# Patient Record
Sex: Female | Born: 1995 | Race: White | Hispanic: Yes | Marital: Married | Smoking: Never smoker
Health system: Southern US, Community
[De-identification: ages and names within clinical notes are randomized; demographics above are authoritative.]

## PROBLEM LIST (undated history)

## (undated) DIAGNOSIS — Z789 Other specified health status: Secondary | ICD-10-CM

## (undated) HISTORY — PX: WISDOM TOOTH EXTRACTION: SHX21

---
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
## 2018-07-10 NOTE — L&D Delivery Note (Signed)
OB/GYN Faculty Practice Delivery Note  Kimberly Fox is a 23 y.o. G1P0 s/p SVD at [redacted]w[redacted]d. She was admitted for spontaneous onset of labor.   ROM: 4h 74m with clear fluid GBS Status: positive Maximum Maternal Temperature: Temp (24hrs), Avg:98.5 F (36.9 C), Min:97.8 F (36.6 C), Max:99.2 F (37.3 C)  Labor Progress: . Admitted in active labor . SROM around 0630 . AROM forebag . Progressed to complete without augmentation  Delivery Date/Time: 02/10/19 at 1057 Delivery: Called to room and patient was complete and pushing. Head delivered ROA. No nuchal cord present. Shoulder and body delivered in usual fashion. Infant with spontaneous cry, placed on mother's abdomen, dried and stimulated. Cord clamped x 2 after 1-minute delay, and cut by father of baby. Cord blood drawn. Placenta delivered spontaneously with gentle cord traction. Fundus firm with massage and Pitocin. Labia, perineum, vagina, and cervix inspected inspected with 1st degree perineal and bilateral periurethral lacerations.   Placenta: spontaneous, intact, 3-vessel cord (parents to bring home with them) Complications: terminal meconium Lacerations: 1st degree perineal, bilateral periurethral repaired with 3-0 Vicryl Rapide EBL: 400cc, see delivery summary for Triton final QBL Analgesia: local lidocaine for repair of lacerations   Postpartum Planning [/] message to sent to schedule follow-up  [x]  vaccines UTD  Infant: Vigorous female   APGARs 9, 9  weight pending but appears AGA  Laurel S. Juleen China, DO OB/GYN Fellow, Faculty Practice

## 2018-07-16 LAB — OB RESULTS CONSOLE HEPATITIS B SURFACE ANTIGEN: Hepatitis B Surface Ag: NEGATIVE

## 2018-07-16 LAB — OB RESULTS CONSOLE RUBELLA ANTIBODY, IGM: Rubella: IMMUNE

## 2018-11-08 LAB — OB RESULTS CONSOLE HIV ANTIBODY (ROUTINE TESTING): HIV: NONREACTIVE

## 2018-11-08 LAB — OB RESULTS CONSOLE RPR: RPR: NONREACTIVE

## 2019-01-22 LAB — OB RESULTS CONSOLE GBS: GBS: POSITIVE

## 2019-01-22 LAB — OB RESULTS CONSOLE GC/CHLAMYDIA
Chlamydia: NEGATIVE
Gonorrhea: NEGATIVE

## 2019-02-09 ENCOUNTER — Inpatient Hospital Stay (EMERGENCY_DEPARTMENT_HOSPITAL)
Admission: AD | Admit: 2019-02-09 | Discharge: 2019-02-09 | Disposition: A | Discharge status: Home / Self Care | Payer: BC Managed Care – PPO | Source: Home / Self Care | Attending: Obstetrics & Gynecology | Admitting: Obstetrics & Gynecology

## 2019-02-09 ENCOUNTER — Other Ambulatory Visit: Payer: Self-pay

## 2019-02-09 ENCOUNTER — Encounter (HOSPITAL_COMMUNITY): Payer: Self-pay

## 2019-02-09 DIAGNOSIS — Z3A39 39 weeks gestation of pregnancy: Secondary | ICD-10-CM

## 2019-02-09 DIAGNOSIS — O471 False labor at or after 37 completed weeks of gestation: Secondary | ICD-10-CM | POA: Diagnosis not present

## 2019-02-09 HISTORY — DX: Other specified health status: Z78.9

## 2019-02-09 NOTE — Discharge Instructions (Signed)
Braxton Hicks Contractions °Contractions of the uterus can occur throughout pregnancy, but they are not always a sign that you are in labor. You may have practice contractions called Braxton Hicks contractions. These false labor contractions are sometimes confused with true labor. °What are Braxton Hicks contractions? °Braxton Hicks contractions are tightening movements that occur in the muscles of the uterus before labor. Unlike true labor contractions, these contractions do not result in opening (dilation) and thinning of the cervix. Toward the end of pregnancy (32-34 weeks), Braxton Hicks contractions can happen more often and may become stronger. These contractions are sometimes difficult to tell apart from true labor because they can be very uncomfortable. You should not feel embarrassed if you go to the hospital with false labor. °Sometimes, the only way to tell if you are in true labor is for your health care provider to look for changes in the cervix. The health care provider will do a physical exam and may monitor your contractions. If you are not in true labor, the exam should show that your cervix is not dilating and your water has not broken. °If there are no other health problems associated with your pregnancy, it is completely safe for you to be sent home with false labor. You may continue to have Braxton Hicks contractions until you go into true labor. °How to tell the difference between true labor and false labor °True labor °· Contractions last 30-70 seconds. °· Contractions become very regular. °· Discomfort is usually felt in the top of the uterus, and it spreads to the lower abdomen and low back. °· Contractions do not go away with walking. °· Contractions usually become more intense and increase in frequency. °· The cervix dilates and gets thinner. °False labor °· Contractions are usually shorter and not as strong as true labor contractions. °· Contractions are usually irregular. °· Contractions  are often felt in the front of the lower abdomen and in the groin. °· Contractions may go away when you walk around or change positions while lying down. °· Contractions get weaker and are shorter-lasting as time goes on. °· The cervix usually does not dilate or become thin. °Follow these instructions at home: ° °· Take over-the-counter and prescription medicines only as told by your health care provider. °· Keep up with your usual exercises and follow other instructions from your health care provider. °· Eat and drink lightly if you think you are going into labor. °· If Braxton Hicks contractions are making you uncomfortable: °? Change your position from lying down or resting to walking, or change from walking to resting. °? Sit and rest in a tub of warm water. °? Drink enough fluid to keep your urine pale yellow. Dehydration may cause these contractions. °? Do slow and deep breathing several times an hour. °· Keep all follow-up prenatal visits as told by your health care provider. This is important. °Contact a health care provider if: °· You have a fever. °· You have continuous pain in your abdomen. °Get help right away if: °· Your contractions become stronger, more regular, and closer together. °· You have fluid leaking or gushing from your vagina. °· You pass blood-tinged mucus (bloody show). °· You have bleeding from your vagina. °· You have low back pain that you never had before. °· You feel your baby’s head pushing down and causing pelvic pressure. °· Your baby is not moving inside you as much as it used to. °Summary °· Contractions that occur before labor are   called Braxton Hicks contractions, false labor, or practice contractions. °· Braxton Hicks contractions are usually shorter, weaker, farther apart, and less regular than true labor contractions. True labor contractions usually become progressively stronger and regular, and they become more frequent. °· Manage discomfort from Braxton Hicks contractions  by changing position, resting in a warm bath, drinking plenty of water, or practicing deep breathing. °This information is not intended to replace advice given to you by your health care provider. Make sure you discuss any questions you have with your health care provider. °Document Released: 11/09/2016 Document Revised: 06/08/2017 Document Reviewed: 11/09/2016 °Elsevier Patient Education © 2020 [employer] ° °Safe Medications in Pregnancy  ° °Acne: °Benzoyl Peroxide °Salicylic Acid ° °Backache/Headache: °Tylenol: 2 regular strength every 4 hours OR °             2 Extra strength every 6 hours ° °Colds/Coughs/Allergies: °Benadryl (alcohol free) 25 mg every 6 hours as needed °Breath right strips °Claritin °Cepacol throat lozenges °Chloraseptic throat spray °Cold-Eeze- up to three times per day °Cough drops, alcohol free °Flonase (by prescription only) °Guaifenesin °Mucinex °Robitussin DM (plain only, alcohol free) °Saline nasal spray/drops °Sudafed (pseudoephedrine) & Actifed ** use only after [redacted] weeks gestation and if you do not have high blood pressure °Tylenol °Vicks Vaporub °Zinc lozenges °Zyrtec  ° °Constipation: °Colace °Ducolax suppositories °Fleet enema °Glycerin suppositories °Metamucil °Milk of magnesia °Miralax °Senokot °Smooth move tea ° °Diarrhea: °Kaopectate °Imodium A-D ° °*NO pepto Bismol ° °Hemorrhoids: °Anusol °Anusol HC °Preparation H °Tucks ° °Indigestion: °Tums °Maalox °Mylanta °Zantac  °Pepcid ° °Insomnia: °Benadryl (alcohol free) 25mg every 6 hours as needed °Tylenol PM °Unisom, no Gelcaps ° °Leg Cramps: °Tums °MagGel ° °Nausea/Vomiting:  °Bonine °Dramamine °Emetrol °Ginger extract °Sea bands °Meclizine  °Nausea medication to take during pregnancy:  °Unisom (doxylamine succinate 25 mg tablets) Take one tablet daily at bedtime. If symptoms are not adequately controlled, the dose can be increased to a maximum recommended dose of two tablets daily (1/2 tablet in the morning, 1/2 tablet  mid-afternoon and one at bedtime). °Vitamin B6 100mg tablets. Take one tablet twice a day (up to 200 mg per day). ° °Skin Rashes: °Aveeno products °Benadryl cream or 25mg every 6 hours as needed °Calamine Lotion °1% cortisone cream ° °Yeast infection: °Gyne-lotrimin 7 °Monistat 7 ° ° °**If taking multiple medications, please check labels to avoid duplicating the same active ingredients °**take medication as directed on the label °** Do not exceed 4000 mg of tylenol in 24 hours °**Do not take medications that contain aspirin or ibuprofen ° ° ° ° °

## 2019-02-09 NOTE — MAU Provider Note (Signed)
S: Kimberly Fox is a 23 y.o. G1P0 at [redacted]w[redacted]d  who presents to MAU today complaining contractions q 5-10 minutes since this morning. She denies vaginal bleeding. She denies LOF. She reports normal fetal movement.    O: BP 128/70 (BP Location: Right Arm)   Pulse 90   Temp 97.8 F (36.6 C) (Oral)   Resp 18   Ht 5' (1.524 m)   Wt 78 kg   SpO2 99%   BMI 33.59 kg/m  GENERAL: Well-developed, well-nourished female in no acute distress.  HEAD: Normocephalic, atraumatic.  CHEST: Normal effort of breathing, regular heart rate ABDOMEN: Soft, nontender, gravid  Cervical exam:  Dilation: 3 Effacement (%): 80 Cervical Position: Middle Station: -2 Presentation: Vertex Exam by:: Joya Gaskins RN   Fetal Monitoring: Baseline: 125 Variability: moderate Accelerations: 15x15 Decelerations: none Contractions: 2-7  Patient was observed for 1.5 hours and no cervical change made.  A: SIUP at [redacted]w[redacted]d  False labor  P: Discharge home -Labor precautions discussed -Patient advised to follow-up with [facility] as scheduled for prenatal care -Patient may return to MAU as needed or if her condition were to change or worsen   Wende Mott, [place] 02/09/2019 10:19 PM

## 2019-02-09 NOTE — MAU Note (Signed)
Pt here with reports of contractions every 5-6 minutes apart. Pt denies LOF or vaginal bleeding. She does report some pink spotting. Reports good fetal movement. Cervix was 1cm 2 weeks ago. Pt reports that she gets here care at [facility]. EDC 02/12/19 per pt

## 2019-02-09 NOTE — MAU Note (Signed)
I have communicated with Len Blalock CNM and reviewed vital signs:  Vitals:   02/09/19 2030 02/09/19 2235  BP: 128/70 126/75  Pulse: 90 90  Resp:    Temp:    SpO2:      Vaginal exam:  Dilation: 3 Effacement (%): 80 Cervical Position: Middle Station: -2 Presentation: Vertex Exam by:: K. WeissRN,   Also reviewed contraction pattern and that non-stress test is reactive.  It has been documented that patient is contracting every 1.5 - 5 minutes with no cervical change over  2 1/2 hours not indicating active labor.  Patient denies any other complaints.  Based on this report provider has given order for discharge.  A discharge order and diagnosis entered by a provider.   Labor discharge instructions reviewed with patient.

## 2019-02-09 NOTE — MAU Note (Signed)
Pt wanted to wait another hour and be recheck prior to going home.

## 2019-02-10 ENCOUNTER — Inpatient Hospital Stay (HOSPITAL_COMMUNITY)
Admission: AD | Admit: 2019-02-10 | Discharge: 2019-02-12 | DRG: 807 | Disposition: A | Discharge status: Home / Self Care | Payer: BC Managed Care – PPO | Attending: Obstetrics & Gynecology | Admitting: Obstetrics & Gynecology

## 2019-02-10 ENCOUNTER — Encounter (HOSPITAL_COMMUNITY): Payer: Self-pay | Admitting: *Deleted

## 2019-02-10 DIAGNOSIS — O99824 Streptococcus B carrier state complicating childbirth: Secondary | ICD-10-CM | POA: Diagnosis present

## 2019-02-10 DIAGNOSIS — B951 Streptococcus, group B, as the cause of diseases classified elsewhere: Secondary | ICD-10-CM | POA: Diagnosis present

## 2019-02-10 DIAGNOSIS — Z20828 Contact with and (suspected) exposure to other viral communicable diseases: Secondary | ICD-10-CM | POA: Diagnosis present

## 2019-02-10 DIAGNOSIS — Z3A39 39 weeks gestation of pregnancy: Secondary | ICD-10-CM

## 2019-02-10 DIAGNOSIS — O26893 Other specified pregnancy related conditions, third trimester: Secondary | ICD-10-CM | POA: Diagnosis present

## 2019-02-10 HISTORY — DX: Streptococcus, group b, as the cause of diseases classified elsewhere: B95.1

## 2019-02-10 LAB — CBC
HCT: 40.9 % (ref 36.0–46.0)
Hemoglobin: 13.4 g/dL (ref 12.0–15.0)
MCH: 25.5 pg — ABNORMAL LOW (ref 26.0–34.0)
MCHC: 32.8 g/dL (ref 30.0–36.0)
MCV: 77.9 fL — ABNORMAL LOW (ref 80.0–100.0)
Platelets: 233 10*3/uL (ref 150–400)
RBC: 5.25 MIL/uL — ABNORMAL HIGH (ref 3.87–5.11)
RDW: 15.1 % (ref 11.5–15.5)
WBC: 16.4 10*3/uL — ABNORMAL HIGH (ref 4.0–10.5)
nRBC: 0 % (ref 0.0–0.2)

## 2019-02-10 LAB — POCT FERN TEST: POCT Fern Test: NEGATIVE

## 2019-02-10 LAB — ABO/RH: ABO/RH(D): B POS

## 2019-02-10 LAB — TYPE AND SCREEN
ABO/RH(D): B POS
Antibody Screen: NEGATIVE

## 2019-02-10 LAB — SARS CORONAVIRUS 2 BY RT PCR (HOSPITAL ORDER, PERFORMED IN ~~LOC~~ HOSPITAL LAB): SARS Coronavirus 2: NEGATIVE

## 2019-02-10 LAB — RPR: RPR Ser Ql: NONREACTIVE

## 2019-02-10 MED ORDER — LIDOCAINE HCL (PF) 1 % IJ SOLN
30.0000 mL | INTRAMUSCULAR | Status: AC | PRN
  Administered 2019-02-10: 30 mL via SUBCUTANEOUS
  Filled 2019-02-10: qty 30

## 2019-02-10 MED ORDER — ONDANSETRON HCL 4 MG/2ML IJ SOLN: 4.0000 mg | INTRAMUSCULAR | Status: DC | PRN

## 2019-02-10 MED ORDER — OXYCODONE-ACETAMINOPHEN 5-325 MG PO TABS: 2.0000 | ORAL_TABLET | ORAL | Status: DC | PRN

## 2019-02-10 MED ORDER — COCONUT OIL OIL: 1.0000 "application " | TOPICAL_OIL | Status: DC | PRN

## 2019-02-10 MED ORDER — WITCH HAZEL-GLYCERIN EX PADS: 1.0000 "application " | MEDICATED_PAD | CUTANEOUS | Status: DC | PRN

## 2019-02-10 MED ORDER — DIPHENHYDRAMINE HCL 25 MG PO CAPS: 25.0000 mg | ORAL_CAPSULE | Freq: Four times a day (QID) | ORAL | Status: DC | PRN

## 2019-02-10 MED ORDER — BENZOCAINE-MENTHOL 20-0.5 % EX AERO
1.0000 "application " | INHALATION_SPRAY | CUTANEOUS | Status: DC | PRN
  Administered 2019-02-10: 1 via TOPICAL
  Filled 2019-02-10 (×2): qty 56

## 2019-02-10 MED ORDER — FENTANYL CITRATE (PF) 100 MCG/2ML IJ SOLN
100.0000 ug | Freq: Once | INTRAMUSCULAR | Status: AC
  Administered 2019-02-10: 100 ug via INTRAVENOUS

## 2019-02-10 MED ORDER — ACETAMINOPHEN 325 MG PO TABS: 650.0000 mg | ORAL_TABLET | ORAL | Status: DC | PRN

## 2019-02-10 MED ORDER — FENTANYL CITRATE (PF) 100 MCG/2ML IJ SOLN
INTRAMUSCULAR | Status: AC
  Administered 2019-02-10: 100 ug via INTRAVENOUS
  Filled 2019-02-10: qty 2

## 2019-02-10 MED ORDER — FENTANYL CITRATE (PF) 100 MCG/2ML IJ SOLN
100.0000 ug | Freq: Once | INTRAMUSCULAR | Status: AC
  Administered 2019-02-10: 100 ug via INTRAVENOUS
  Filled 2019-02-10: qty 2

## 2019-02-10 MED ORDER — OXYCODONE-ACETAMINOPHEN 5-325 MG PO TABS: 1.0000 | ORAL_TABLET | ORAL | Status: DC | PRN

## 2019-02-10 MED ORDER — PRENATAL MULTIVITAMIN CH
1.0000 | ORAL_TABLET | Freq: Every day | ORAL | Status: DC
  Administered 2019-02-11 – 2019-02-12 (×2): 1 via ORAL
  Filled 2019-02-10 (×2): qty 1

## 2019-02-10 MED ORDER — OXYTOCIN BOLUS FROM INFUSION: 500.0000 mL | Freq: Once | INTRAVENOUS | Status: DC

## 2019-02-10 MED ORDER — LACTATED RINGERS IV SOLN
INTRAVENOUS | Status: DC
  Administered 2019-02-10: 04:00:00 via INTRAVENOUS

## 2019-02-10 MED ORDER — SIMETHICONE 80 MG PO CHEW: 80.0000 mg | CHEWABLE_TABLET | ORAL | Status: DC | PRN

## 2019-02-10 MED ORDER — SENNOSIDES-DOCUSATE SODIUM 8.6-50 MG PO TABS
2.0000 | ORAL_TABLET | ORAL | Status: DC
  Administered 2019-02-11 (×2): 2 via ORAL
  Filled 2019-02-10 (×2): qty 2

## 2019-02-10 MED ORDER — SOD CITRATE-CITRIC ACID 500-334 MG/5ML PO SOLN: 30.0000 mL | ORAL | Status: DC | PRN

## 2019-02-10 MED ORDER — OXYTOCIN 10 UNIT/ML IJ SOLN
INTRAMUSCULAR | Status: AC
  Administered 2019-02-10: 10 [IU]
  Filled 2019-02-10: qty 1

## 2019-02-10 MED ORDER — FLEET ENEMA 7-19 GM/118ML RE ENEM: 1.0000 | ENEMA | RECTAL | Status: DC | PRN

## 2019-02-10 MED ORDER — IBUPROFEN 800 MG PO TABS
800.0000 mg | ORAL_TABLET | Freq: Three times a day (TID) | ORAL | Status: DC
  Administered 2019-02-10 – 2019-02-12 (×7): 800 mg via ORAL
  Filled 2019-02-10 (×7): qty 1

## 2019-02-10 MED ORDER — FENTANYL CITRATE (PF) 100 MCG/2ML IJ SOLN
INTRAMUSCULAR | Status: AC
  Filled 2019-02-10: qty 2

## 2019-02-10 MED ORDER — SODIUM CHLORIDE 0.9 % IV SOLN
2.0000 g | Freq: Once | INTRAVENOUS | Status: AC
  Administered 2019-02-10: 2 g via INTRAVENOUS
  Filled 2019-02-10: qty 2000

## 2019-02-10 MED ORDER — FENTANYL CITRATE (PF) 100 MCG/2ML IJ SOLN
50.0000 ug | Freq: Once | INTRAMUSCULAR | Status: AC
  Administered 2019-02-10: 50 ug via INTRAVENOUS

## 2019-02-10 MED ORDER — LACTATED RINGERS IV SOLN: 500.0000 mL | INTRAVENOUS | Status: DC | PRN

## 2019-02-10 MED ORDER — OXYTOCIN 40 UNITS IN NORMAL SALINE INFUSION - SIMPLE MED
2.5000 [IU]/h | INTRAVENOUS | Status: DC
  Filled 2019-02-10: qty 1000

## 2019-02-10 MED ORDER — ONDANSETRON HCL 4 MG PO TABS: 4.0000 mg | ORAL_TABLET | ORAL | Status: DC | PRN

## 2019-02-10 MED ORDER — ONDANSETRON HCL 4 MG/2ML IJ SOLN
4.0000 mg | Freq: Four times a day (QID) | INTRAMUSCULAR | Status: DC | PRN
  Administered 2019-02-10: 4 mg via INTRAVENOUS
  Filled 2019-02-10: qty 2

## 2019-02-10 MED ORDER — DIBUCAINE (PERIANAL) 1 % EX OINT: 1.0000 "application " | TOPICAL_OINTMENT | CUTANEOUS | Status: DC | PRN

## 2019-02-10 NOTE — H&P (Addendum)
OBSTETRIC ADMISSION HISTORY AND PHYSICAL  Kimberly Fox is a 23 y.o. female G1P0 with IUP at 6826w5d by LMP=8w u/s presenting for active labor.   Reports fetal movement. Denies vaginal bleeding. Endorses ctx every 2-4 minutes. No LOF.  She [facility] [place] ([place]) Support person in labor: FOB  Ultrasounds Anatomy U/S 09/13/2018 GA: 6625w2d  EGA by US today: 18.3 Final EDC: Estimated Date of Delivery: 02/12/19  EFW: 236g Grossly normal Markers of Aneuploidy: none seen  Gender: gender in envelope FHR: 159 bpm Placenta: anterior Ovaries: normal Amniotic fluid: subjectively normal Cervix: long, closed, no funneling   Prenatal History/Complications: Marland Kitchen. GBS positive  Past Medical History: Past Medical History:  Diagnosis Date  . Medical history non-contributory     Past Surgical History: Past Surgical History:  Procedure Laterality Date  . WISDOM TOOTH EXTRACTION      Obstetrical History: OB History    Gravida  1   Para      Term      Preterm      AB      Living        SAB      TAB      Ectopic      Multiple      Live Births              Social History: Social History   Socioeconomic History  . Marital status: Single    Spouse name: Not on file  . Number of children: Not on file  . Years of education: Not on file  . Highest education level: Not on file  Occupational History  . Not on file  Social Needs  . Financial resource strain: Not on file  . Food insecurity    Worry: Not on file    Inability: Not on file  . Transportation needs    Medical: Not on file    Non-medical: Not on file  Tobacco Use  . Smoking status: Never Smoker  . Smokeless tobacco: Never Used  Substance and Sexual Activity  . Alcohol use: No  . Drug use: No  . Sexual activity: Yes    Comment: last sex about 2 weeks ago  Lifestyle  . Physical activity    Days per week: Not on file    Minutes per  session: Not on file  . Stress: Not on file  Relationships  . Social Musicianconnections    Talks on phone: Not on file    Gets together: Not on file    Attends religious service: Not on file    Active member of club or organization: Not on file    Attends meetings of clubs or organizations: Not on file    Relationship status: Not on file  Other Topics Concern  . Not on file  Social History Narrative  . Not on file    Family History: No family history on file.  Allergies: No Known Allergies  Medications Prior to Admission  Medication Sig Dispense Refill Last Dose  . prenatal vitamin w/FE, FA (PRENATAL 1 + 1) 27-1 MG TABS tablet Take 1 tablet by mouth daily at 12 noon.        Review of Systems  All systems reviewed and negative except as stated in HPI  Blood pressure 132/73, pulse 92, temperature 98.2 F (36.8 C), temperature source Axillary, resp. rate 18, SpO2 100 %. General appearance: alert, cooperative and moderate distress Lungs: no respiratory distress  Heart: regular rate  Abdomen: soft, non-tender; gravid b Pelvic: deferred Extremities: Homans sign is negative, no sign of DVT Presentation: cephalic Fetal monitoring: baseline 150bpm, minimal variability, no accels, no decels Uterine activity: q2-4 minutes Dilation: 7 Effacement (%): 90 Station: 0 Exam by:: Darrol Poke, CNM  Prenatal labs: ABO, Rh:   Antibody:   Rubella:   RPR:    HBsAg:    HIV:    GBS:    Glucola: passed 1hr GTT (130) Genetic screening:  Low risk  Prenatal Transfer Tool  Maternal Diabetes: No Genetic Screening: Normal Maternal Ultrasounds/Referrals: Normal Fetal Ultrasounds or other Referrals:  None Maternal Substance Abuse:  No Significant Maternal Medications:  None Significant Maternal Lab Results: Group B Strep positive  No results found for this or any previous visit (from the past 24 hour(s)).  Patient Active Problem List   Diagnosis Date Noted  . Normal labor 02/10/2019   . Group B streptococcal infection during pregnancy 02/10/2019    Assessment/Plan:  Kimberly Fox is a 23 y.o. G1P0 at [redacted]w[redacted]d by LMP=8w u/s here for active labor.  Labor: progressing well, will continue time appropriate cervical checks. -- pain control: IV fentanyl, does not desire epidural  Fetal Wellbeing: Cephalic by cervical exam.  -- GBS (positive)- amp 2g x1 ordered -- continuous fetal monitoring - cat II after fentanyl  Postpartum Planning -- breast -- Rubella imm/Tdap 12/09/930   Corliss Blacker, [facility]  I confirm that I have verified the information documented in the resident's note and that I have also personally reperformed the history, physical exam and all medical decision making activities of this service and have verified that all service and findings are accurately documented in this student's note.   Wende Mott, [place] 02/10/2019 7:38 AM

## 2019-02-10 NOTE — Discharge Summary (Signed)
Obstetrics Discharge Summary OB/GYN Faculty Practice   Patient Name: Kimberly Fox DOB: [birth year] MRN: [id number]  Date of admission: 02/10/2019 Delivering MD: Glenice Bow   Date of discharge: 02/12/2019  Admitting diagnosis: 76WKS CTX Intrauterine pregnancy: [redacted]w[redacted]d     Secondary diagnosis:   Active Problems:   Normal labor   Group B streptococcal infection during pregnancy   Vaginal delivery    Discharge diagnosis: Term Pregnancy Delivered                                            Postpartum procedures: None  Complications: 1st degree perineal laceration (repaired)  Outpatient Follow-Up: 4 weeks postpartum at [place] in [facility] course: Kimberly Fox is a 23 y.o. [redacted]w[redacted]d who was admitted for spontaneous onset of labor. Her pregnancy was complicated by GBS positive status. Her labor course was notable for admission in active labor, SROM with AROM of forebag, unmedicated delivery. Delivery was complicated by 1st degree perineal and bilateral periurethral lacerations. Please see delivery/op note for additional details. Her postpartum course was uncomplicated. She was breastfeeding without difficulty. By day of discharge, she was passing flatus, urinating, eating and drinking without difficulty. Her pain was well-controlled, and she was discharged home with baby. She will follow-up in clinic in 4 weeks.   Physical exam  Vitals:   02/11/19 0142 02/11/19 0610 02/11/19 1356 02/11/19 2213  BP: (!) 110/56 107/62 112/62 115/60  Pulse: 80 73 83 72  Resp: 18 19 16 19   Temp: 98 F (36.7 C) 97.7 F (36.5 C) 97.7 F (36.5 C) 99.1 F (37.3 C)  TempSrc:  Bladder Oral Oral  SpO2: 100% 100%     General: No distress, tolerating POs and activity well Lochia: appropriate Uterine Fundus: firm Incision: N/A DVT Evaluation: No evidence of DVT seen on physical exam.  Labs: Lab Results  Component Value Date   WBC 16.4 (H) 02/10/2019   HGB 13.4 02/10/2019   HCT 40.9 02/10/2019    MCV 77.9 (L) 02/10/2019   PLT 233 02/10/2019   No flowsheet data found.  Discharge instructions: Per After Visit Summary and "Baby and Me Booklet"  After visit meds:  Allergies as of 02/12/2019   No Known Allergies     Medication List    TAKE these medications   ibuprofen 800 MG tablet Commonly known as: ADVIL Take 1 tablet (800 mg total) by mouth 3 (three) times daily.   prenatal vitamin w/FE, FA 27-1 MG Tabs tablet Take 1 tablet by mouth daily at 12 noon.       Postpartum contraception: Undecided Diet: Routine Diet Activity: Advance as tolerated. Pelvic rest for 6 weeks.   Follow-up Appt: Encouraged to follow-up with prenatal provider in 4-6 weeks ([place])   Newborn Data: Live born female  Birth Weight:   APGAR: 35, 22  Newborn Delivery   Birth date/time: 02/10/2019 10:57:00 Delivery type: Vaginal, Spontaneous      Baby Feeding: Breast Disposition:home with mother

## 2019-02-10 NOTE — Progress Notes (Signed)
OB/GYN Faculty Practice: Labor Progress Note  Subjective: Patient doing well without complaints.  Objective: BP 139/78   Pulse 78   Temp 98.3 F (36.8 C) (Oral)   Resp 16   SpO2 100% Comment: room air  Gen: well appearing, NAD Dilation: 8.5 Effacement (%): 90 Cervical Position: Middle Station: 0 Presentation: Vertex Exam by:: Hector Shade RN  Assessment and Plan: 23 y.o. G1P0 [redacted]w[redacted]d who presents for active labor.  Labor: progressing well, ?SROM @0625 , RN reports patient leaking minimal clear fluid although still has bulging bag, will proceed with time appropriate cervical exams and AROM when able -- pain control: receiving IV fentanyl PRN -- PPH Risk: low  Fetal Well-Being:  -- Category 1 - continuous fetal monitoring  -- GBS positive (s/p amp x1)   Postpartum Planning -- breast -- Rubella imm/Tdap 9/62/9528   Corliss Blacker, PGY-III Family Medicine 7:47 AM

## 2019-02-10 NOTE — MAU Note (Addendum)
Presents with cxts increasing in frequency, every 2-4 min approx 2 hours ago. Pos FM No LOF or vag bldg at home; however has bloody show with watery discharge in MAU now. Pain 10/10. Pt with approx five episodes of vomtting since MAU discharge @ 2329 last night. Darrol Poke en route to assist in transport to L&D. No IV or labs could be obtained in MAU due to dilation and severe discomfort with cxts. Covid screening done.    Gilmer Mor RN

## 2019-02-10 NOTE — Discharge Instructions (Signed)

## 2019-02-10 NOTE — Lactation Note (Signed)
This note was copied from a baby's chart. Lactation Consultation Note  Patient Name: Kimberly Fox [id number] Date: 02/10/2019 Reason for consult: 1st time breastfeeding;Term P1, 9 hour female infant. Mom participated in Breastfeeding audit survey.  Infant had 2 stools since birth. Per mom, she has DEBP at home. Per mom, this is her third time latching infant to breast, Mom is experiencing pain with latches, LC notice to abrasions above each nipples where infant had shallow latch. LC ask mom hand express a little colostrum prior to latching infant, rub nipple below nose, wait until infant's mouth is wide with tongue down, bring infant to breast chin first, with nose and chin touching breast. Mom latched infant on right breast without difficulty, chin and nose touch and few swallows observed, mom stated she did not feel pain with this latch on a tug in the beginning of the latch. Infant breastfeed for 10 minutes. Mom hand expressed 3 ml of  Colostrum that was spoon feed to infant.  Mom rub colostrum on breast to help heal abrasions and was given comfort gels to wear in bra and LC explained how to use. Mom will continue to work on latching infant to breast without pain, looking at how she positions and latches infant to breast, mom knows to break latch if experiencing pain and re-latch infant to breast. LC alert Nurse and Nurse will give mom coconut oil and LC explained to mom she can not use comfort  gels and coconut oil at the same time.  Mom knows to call Nurse or [name] if she needs assistance with latching infant to breast or if she has further questions or concerns regarding breastfeeding. Mo knows to breastfeed infant according hunger cues, 8 to 12 times within 24 hours and on demand. Dad was doing STS as [name] left the room. Reviewed Baby & Me book's Breastfeeding Basics.  Mom made aware of O/P services, breastfeeding support groups, community resources, and our phone # for post-discharge  questions.  Maternal Data Formula Feeding for Exclusion: No Has patient been taught Hand Expression?: Yes(Infant was given 3 ml of colostrum by spoon.)  Feeding Feeding Type: Breast Fed  LATCH Score Latch: Grasps breast easily, tongue down, lips flanged, rhythmical sucking.  Audible Swallowing: A few with stimulation  Type of Nipple: Everted at rest and after stimulation  Comfort (Breast/Nipple): Filling, red/small blisters or bruises, mild/mod discomfort  Hold (Positioning): Assistance needed to correctly position infant at breast and maintain latch.  LATCH Score: 7  Interventions Interventions: Breast feeding basics reviewed;Breast compression;Comfort gels;Adjust position;Assisted with latch;Skin to skin;Support pillows;Position options;Breast massage;Hand express;Expressed milk;Coconut oil  Lactation Tools Discussed/Used Tools: Coconut oil;Comfort gels WIC Program: No   Consult Status Consult Status: Follow-up Date: 02/11/19 Follow-up type: In-patient    [name] 02/10/2019, 8:18 PM

## 2019-02-10 NOTE — Progress Notes (Signed)
LABOR PROGRESS NOTE  Kimberly Fox is a 23 y.o. G1P0 at [redacted]w[redacted]d  admitted for SOL.  Subjective: She is in bed experiencing significant discomfort at this time.   Objective: BP 128/73   Pulse 79   Temp 98.3 F (36.8 C) (Oral)   Resp 18   SpO2 100% Comment: room air  or  Vitals:   02/10/19 0803 02/10/19 0811 02/10/19 0839 02/10/19 0850  BP:  132/70 128/73   Pulse:  74 79   Resp: 20 16 17 18   Temp:      TempSrc:      SpO2:         Dilation: Lip/rim Effacement (%): 100 Cervical Position: Middle Station: Plus 1, Plus 2 Presentation: Vertex Exam by:: Dr. Anette Riedel FHT: baseline rate 135bpm, moderate varibility, 10 x 10 acel, 1 variable decel Toco: 2 min  Labs: Lab Results  Component Value Date   WBC 16.4 (H) 02/10/2019   HGB 13.4 02/10/2019   HCT 40.9 02/10/2019   MCV 77.9 (L) 02/10/2019   PLT 233 02/10/2019    Patient Active Problem List   Diagnosis Date Noted  . Normal labor 02/10/2019  . Group B streptococcal infection during pregnancy 02/10/2019    Assessment / Plan: 23 y.o. G1P0 at [redacted]w[redacted]d here for SOL.  Labor: Expectant management. Continue cervical exam for completion. Fetal Wellbeing:  Cat I Pain Control:  Maternally supported. Fentanyl desired at this time. Anticipated MOD: Vaginal  Kimberly Fox, D.O. Family Medicine Resident, PGY-1 02/10/2019, 9:11 AM

## 2019-02-11 NOTE — Lactation Note (Signed)
This note was copied from a baby's chart. Lactation Consultation Note  Patient Name: Girl Karynn Deblasi [id number] Date: 02/11/2019 Reason for consult: Follow-up assessment;Nipple pain/trauma;Term  Baby is 24 hours/2% weight loss. Mom states she has given formula twice to rest painful nipples.  She is wearing comfort gels.  Reviewed supply and demand.  Discussed initiating pumping if unable to tolerate baby at breast.  Instructed to feed with cues and call for latch assist prn.  Maternal Data    Feeding Feeding Type: Bottle Fed - Formula  LATCH Score                   Interventions    Lactation Tools Discussed/Used     Consult Status Consult Status: Follow-up Date: 02/12/19 Follow-up type: In-patient    Ave Filter 02/11/2019, 11:12 AM

## 2019-02-11 NOTE — Progress Notes (Signed)
POSTPARTUM PROGRESS NOTE  Subjective: Kimberly Fox is a 23 y.o. [id number] s/p uncomplicated SVD at [redacted]w[redacted]d.  She reports she doing well. No acute events overnight. She denies any problems with ambulating, voiding or po intake. Denies nausea or vomiting. She has passed flatus. Pain is well controlled.  Lochia is improving.  Objective: Blood pressure (!) 110/56, pulse 80, temperature 98 F (36.7 C), resp. rate 18, SpO2 100 %, unknown if currently breastfeeding.  Physical Exam:  General: alert, cooperative and no distress Chest: no respiratory distress Abdomen: soft, non-tender  Uterine Fundus: firm, appropriately tender Extremities: No calf swelling or tenderness  no edema  Recent Labs    02/10/19 0323  HGB 13.4  HCT 40.9    Assessment/Plan: Kimberly Fox is a 23 y.o. [id number] s/p uncomplicated SVD at [redacted]w[redacted]d after presenting in active labor.  Routine Postpartum Care: Doing well, pain well-controlled.  -- Continue routine care, lactation support  -- Contraception: declines at this time, will think about before postpartum visit -- Feeding: breast  Dispo: Plan for discharge home likely tomorrow.  Corliss Blacker, [facility]

## 2019-02-12 MED ORDER — IBUPROFEN 800 MG PO TABS: 800.0000 mg | ORAL_TABLET | Freq: Three times a day (TID) | ORAL | 0 refills | Status: DC

## 2019-02-12 NOTE — Lactation Note (Signed)
This note was copied from a baby's chart. Lactation Consultation Note  Patient Name: Kimberly Fox [id number] Date: 02/12/2019 Reason for consult: Follow-up assessment;Nipple pain/trauma Baby is 47 hours old/2% weight loss.  Mom has been mostly formula feeding due to sore nipples and difficult latch on left breast.  Baby is currently fussy and short lingual frenulum noted.  Baby positioned in football hold on left.  20 mm nipple shield applied and baby latched easily.  Mom states she felt initial latch on pain which subsided.  Baby active at breast with good breast massage and compression.  Good swallows noted.  Discussed milk coming to volume and the prevention and treatment of engorgement.  Mom using comfort gels between feedings.  She has a breast pump at home.  Instructed to post pump every 3 hours and give expressed milk back to baby.  Mom will discuss frenulum with her pediatrician.  Reviewed lactation outpatient services and encouraged to call prn.  Maternal Data    Feeding Feeding Type: Breast Fed Nipple Type: Slow - flow  LATCH Score Latch: Grasps breast easily, tongue down, lips flanged, rhythmical sucking.(with nipple shield)  Audible Swallowing: Spontaneous and intermittent  Type of Nipple: Everted at rest and after stimulation  Comfort (Breast/Nipple): Filling, red/small blisters or bruises, mild/mod discomfort  Hold (Positioning): Assistance needed to correctly position infant at breast and maintain latch.  LATCH Score: 8  Interventions Interventions: Assisted with latch;Breast massage;Comfort gels;Breast compression;Adjust position;Support pillows  Lactation Tools Discussed/Used Tools: Nipple Shields Nipple shield size: 20   Consult Status Consult Status: Complete Follow-up type: Call as needed    Afnan Cadiente S 02/12/2019, 10:20 AM

## 2019-03-02 ENCOUNTER — Inpatient Hospital Stay (HOSPITAL_COMMUNITY): Payer: BC Managed Care – PPO

## 2019-03-02 ENCOUNTER — Observation Stay (HOSPITAL_COMMUNITY)
Admission: AD | Admit: 2019-03-02 | Discharge: 2019-03-03 | Disposition: A | Discharge status: Home / Self Care | Payer: BC Managed Care – PPO | Attending: Surgery | Admitting: Surgery

## 2019-03-02 DIAGNOSIS — K810 Acute cholecystitis: Secondary | ICD-10-CM | POA: Diagnosis present

## 2019-03-02 DIAGNOSIS — R109 Unspecified abdominal pain: Secondary | ICD-10-CM | POA: Diagnosis present

## 2019-03-02 DIAGNOSIS — K8010 Calculus of gallbladder with chronic cholecystitis without obstruction: Secondary | ICD-10-CM | POA: Diagnosis present

## 2019-03-02 DIAGNOSIS — Z9049 Acquired absence of other specified parts of digestive tract: Secondary | ICD-10-CM | POA: Diagnosis present

## 2019-03-02 DIAGNOSIS — Z20828 Contact with and (suspected) exposure to other viral communicable diseases: Secondary | ICD-10-CM | POA: Diagnosis not present

## 2019-03-02 DIAGNOSIS — K8000 Calculus of gallbladder with acute cholecystitis without obstruction: Secondary | ICD-10-CM | POA: Diagnosis not present

## 2019-03-02 LAB — COMPREHENSIVE METABOLIC PANEL
ALT: 18 U/L (ref 0–44)
AST: 22 U/L (ref 15–41)
Albumin: 3.6 g/dL (ref 3.5–5.0)
Alkaline Phosphatase: 142 U/L — ABNORMAL HIGH (ref 38–126)
Anion gap: 9 (ref 5–15)
BUN: 9 mg/dL (ref 6–20)
CO2: 24 mmol/L (ref 22–32)
Calcium: 9 mg/dL (ref 8.9–10.3)
Chloride: 107 mmol/L (ref 98–111)
Creatinine, Ser: 0.6 mg/dL (ref 0.44–1.00)
GFR calc Af Amer: >60 mL/min (ref >60)
GFR calc non Af Amer: >60 mL/min (ref >60)
Glucose, Bld: 100 mg/dL — ABNORMAL HIGH (ref 70–99)
Potassium: 3.7 mmol/L (ref 3.5–5.1)
Sodium: 140 mmol/L (ref 135–145)
Total Bilirubin: 0.2 mg/dL — ABNORMAL LOW (ref 0.3–1.2)
Total Protein: 6.5 g/dL (ref 6.5–8.1)

## 2019-03-02 LAB — CBC
HCT: 43.2 % (ref 36.0–46.0)
Hemoglobin: 13.9 g/dL (ref 12.0–15.0)
MCH: 26 pg (ref 26.0–34.0)
MCHC: 32.2 g/dL (ref 30.0–36.0)
MCV: 80.7 fL (ref 80.0–100.0)
Platelets: 250 10*3/uL (ref 150–400)
RBC: 5.35 MIL/uL — ABNORMAL HIGH (ref 3.87–5.11)
RDW: 16.3 % — ABNORMAL HIGH (ref 11.5–15.5)
WBC: 8.7 10*3/uL (ref 4.0–10.5)
nRBC: 0 % (ref 0.0–0.2)

## 2019-03-02 LAB — TYPE AND SCREEN
ABO/RH(D): B POS
Antibody Screen: NEGATIVE

## 2019-03-02 MED ORDER — KETOROLAC TROMETHAMINE 30 MG/ML IJ SOLN
30.0000 mg | Freq: Once | INTRAMUSCULAR | Status: AC
  Administered 2019-03-02: 23:00:00 30 mg via INTRAVENOUS
  Filled 2019-03-02: qty 1

## 2019-03-02 MED ORDER — LACTATED RINGERS IV SOLN
INTRAVENOUS | Status: DC
  Administered 2019-03-02 – 2019-03-03 (×3): via INTRAVENOUS

## 2019-03-02 MED ORDER — FAMOTIDINE IN NACL 20-0.9 MG/50ML-% IV SOLN
20.0000 mg | Freq: Once | INTRAVENOUS | Status: AC
  Administered 2019-03-02: 23:00:00 20 mg via INTRAVENOUS
  Filled 2019-03-02: qty 50

## 2019-03-02 MED ORDER — HYDROMORPHONE HCL 1 MG/ML IJ SOLN
1.0000 mg | Freq: Once | INTRAMUSCULAR | Status: AC
  Administered 2019-03-02: 1 mg via INTRAVENOUS
  Filled 2019-03-02: qty 1

## 2019-03-02 MED ORDER — HYDROMORPHONE HCL 1 MG/ML IJ SOLN
1.0000 mg | Freq: Once | INTRAMUSCULAR | Status: AC
  Administered 2019-03-02: 23:00:00 1 mg via INTRAVENOUS
  Filled 2019-03-02: qty 1

## 2019-03-02 NOTE — MAU Note (Signed)
Patient presents to MAU c/o constant, sharp RUQ pain/pressure. Patient states pain started around 2000 while breastfeeding.  Patient reports taking Tylenol around 2000 with no relief.  PP vaginal delivery with no complications.

## 2019-03-02 NOTE — MAU Provider Note (Signed)
History     CSN: [id number]  Arrival date and time: 03/02/19 2216   First Provider Initiated Contact with Patient 03/02/19 2245      Chief Complaint  Patient presents with  . Abdominal Pain    RUQ   HPI   Kimberly Fox is a 23 y.o. female G1P1001 status post vaginal delivery on 02/10/2019. Here with acute onset of RUQ pain that started at 8:00 PM. She took tylenol at 8:30 PM which did not help. Nausea, no vomiting because it hurts to vomit. Rates her pain 7/10. No fever. The pain is in the center of her abdomen and in the RUQ and radiates at times to her Left upper back.   OB History    Gravida  1   Para  1   Term  1   Preterm      AB      Living  1     SAB      TAB      Ectopic      Multiple  0   Live Births  1           Past Medical History:  Diagnosis Date  . Medical history non-contributory     Past Surgical History:  Procedure Laterality Date  . WISDOM TOOTH EXTRACTION      No family history on file.  Social History   Tobacco Use  . Smoking status: Never Smoker  . Smokeless tobacco: Never Used  Substance Use Topics  . Alcohol use: No  . Drug use: No    Allergies: No Known Allergies  Medications Prior to Admission  Medication Sig Dispense Refill Last Dose  . ibuprofen (ADVIL) 800 MG tablet Take 1 tablet (800 mg total) by mouth 3 (three) times daily. 30 tablet 0 03/02/2019 at Unknown time  . prenatal vitamin w/FE, FA (PRENATAL 1 + 1) 27-1 MG TABS tablet Take 1 tablet by mouth daily at 12 noon.   03/02/2019 at Unknown time   Results for orders placed or performed during the hospital encounter of 03/02/19 (from the past 48 hour(s))  CBC     Status: Abnormal   Collection Time: 03/02/19 11:00 PM  Result Value Ref Range   WBC 8.7 4.0 - 10.5 K/uL   RBC 5.35 (H) 3.87 - 5.11 MIL/uL   Hemoglobin 13.9 12.0 - 15.0 g/dL   HCT 62.143.2 30.836.0 - 65.746.0 %   MCV 80.7 80.0 - 100.0 fL   MCH 26.0 26.0 - 34.0 pg   MCHC 32.2 30.0 - 36.0 g/dL   RDW 84.616.3 (H)  96.211.5 - 15.5 %   Platelets 250 150 - 400 K/uL   nRBC 0.0 0.0 - 0.2 %    Comment: Performed at [facility], [address] [place], [place] [address]  Comprehensive metabolic panel     Status: Abnormal   Collection Time: 03/02/19 11:00 PM  Result Value Ref Range   Sodium 140 135 - 145 mmol/L   Potassium 3.7 3.5 - 5.1 mmol/L   Chloride 107 98 - 111 mmol/L   CO2 24 22 - 32 mmol/L   Glucose, Bld 100 (H) 70 - 99 mg/dL   BUN 9 6 - 20 mg/dL   Creatinine, Ser 1.320.60 0.44 - 1.00 mg/dL   Calcium 9.0 8.9 - 44.010.3 mg/dL   Total Protein 6.5 6.5 - 8.1 g/dL   Albumin 3.6 3.5 - 5.0 g/dL   AST 22 15 - 41 U/L   ALT 18 0 -  44 U/L   Alkaline Phosphatase 142 (H) 38 - 126 U/L   Total Bilirubin 0.2 (L) 0.3 - 1.2 mg/dL   GFR calc non Af Amer >60 >60 mL/min   GFR calc Af Amer >60 >60 mL/min   Anion gap 9 5 - 15    Comment: Performed at [facility], [address] [place], [place] [address]  Type and screen [facility]     Status: None   Collection Time: 03/02/19 11:00 PM  Result Value Ref Range   ABO/RH(D) B POS    Antibody Screen NEG    Sample Expiration      03/05/2019,2359 Performed at [facility], [address] [place], [place] [address]    Dg Abd 2 Views  Result Date: 03/02/2019 CLINICAL DATA:  Right upper quadrant pain EXAM: ABDOMEN - 2 VIEW COMPARISON:  None. FINDINGS: The bowel gas pattern is normal. There is no evidence of free air. No radio-opaque calculi or other significant radiographic abnormality is seen. IMPRESSION: Negative. Electronically Signed   By: Charlett NoseKevin  Dover M.D.   On: 03/02/2019 23:41   [place] Abdomen Limited Ruq  Result Date: 03/03/2019 CLINICAL DATA:  23 year old female with right upper quadrant abdominal pain. EXAM: ULTRASOUND ABDOMEN LIMITED RIGHT UPPER QUADRANT COMPARISON:  Abdominal radiograph dated 03/02/2019 FINDINGS: Gallbladder: The gallbladder is filled with sludge and stones. There is no gallbladder wall thickening or  pericholecystic fluid. Positive sonographic Murphy's sign reported. Common bile duct: Diameter: 2 mm Liver: No focal lesion identified. Within normal limits in parenchymal echogenicity. Portal vein is patent on color Doppler imaging with normal direction of blood flow towards the liver. Other: None. IMPRESSION: Cholelithiasis with equivocal findings for acute cholecystitis. A hepatobiliary scintigraphy may provide better evaluation of the gallbladder if there is a high clinical concern for acute cholecystitis . Electronically Signed   By: Elgie CollardArash  Radparvar M.D.   On: 03/03/2019 00:23    Review of Systems  Constitutional: Negative for fever.  HENT: Negative for sore throat.   Respiratory: Negative for cough and shortness of breath.   Gastrointestinal: Positive for constipation and nausea. Negative for vomiting.   Physical Exam   Blood pressure 122/69, pulse 81, temperature 98.3 F (36.8 C), temperature source Oral, resp. rate 19, height 5' (1.524 m), weight 69.2 kg, SpO2 100 %, unknown if currently breastfeeding.  Physical Exam  Constitutional: She is oriented to person, place, and time. She appears well-developed and well-nourished.  Non-toxic appearance. She has a sickly appearance. She appears ill. She appears distressed.  Respiratory: Effort normal.  GI: Soft. There is abdominal tenderness in the right upper quadrant and epigastric area. There is positive Murphy's sign.  Musculoskeletal: Normal range of motion.  Neurological: She is alert and oriented to person, place, and time.  Skin: Skin is warm.  Psychiatric: Her behavior is normal.   MAU Course  Procedures  None  MDM  Lr @ 125 ml hour CBC, CMP, lipase Dilaudid 1 mg IV x 2  Toradol 30 mg IV  Pepcid 20 mg IV Discussed US findings with [facility]; He recommends [facility] contacted for admission. Discussed patient with Dr. Magnus [facility]Kingsinger [facility] who recommends the patient to be admitted under his  service.  0100: Awaiting bed placement at this time. The patient is comfortable at this time and is aware of the plan of care. Questions answered.   Assessment and Plan   1. Acute cholecystitis   2. Abdominal pain  P:  Admit to general surgery service RN to call for bed placement Pain medication as needed.    Lezlie Lye, NP 03/02/2019 10:55 PM

## 2019-03-03 ENCOUNTER — Observation Stay (HOSPITAL_COMMUNITY): Payer: BC Managed Care – PPO | Admitting: Certified Registered Nurse Anesthetist

## 2019-03-03 ENCOUNTER — Other Ambulatory Visit: Payer: Self-pay

## 2019-03-03 ENCOUNTER — Encounter (HOSPITAL_COMMUNITY): Admission: AD | Disposition: A | Discharge status: Home / Self Care | Payer: Self-pay | Source: Home / Self Care

## 2019-03-03 ENCOUNTER — Encounter (HOSPITAL_COMMUNITY): Payer: Self-pay | Admitting: Certified Registered Nurse Anesthetist

## 2019-03-03 DIAGNOSIS — K8000 Calculus of gallbladder with acute cholecystitis without obstruction: Secondary | ICD-10-CM | POA: Diagnosis not present

## 2019-03-03 DIAGNOSIS — R109 Unspecified abdominal pain: Secondary | ICD-10-CM | POA: Diagnosis present

## 2019-03-03 DIAGNOSIS — K810 Acute cholecystitis: Secondary | ICD-10-CM | POA: Diagnosis present

## 2019-03-03 DIAGNOSIS — Z9049 Acquired absence of other specified parts of digestive tract: Secondary | ICD-10-CM | POA: Diagnosis present

## 2019-03-03 DIAGNOSIS — K8010 Calculus of gallbladder with chronic cholecystitis without obstruction: Secondary | ICD-10-CM | POA: Diagnosis present

## 2019-03-03 HISTORY — PX: CHOLECYSTECTOMY: SHX55

## 2019-03-03 LAB — COMPREHENSIVE METABOLIC PANEL
ALT: 29 U/L (ref 0–44)
AST: 50 U/L — ABNORMAL HIGH (ref 15–41)
Albumin: 3.1 g/dL — ABNORMAL LOW (ref 3.5–5.0)
Alkaline Phosphatase: 120 U/L (ref 38–126)
Anion gap: 9 (ref 5–15)
BUN: 8 mg/dL (ref 6–20)
CO2: 23 mmol/L (ref 22–32)
Calcium: 8.7 mg/dL — ABNORMAL LOW (ref 8.9–10.3)
Chloride: 109 mmol/L (ref 98–111)
Creatinine, Ser: 0.65 mg/dL (ref 0.44–1.00)
GFR calc Af Amer: >60 mL/min (ref >60)
GFR calc non Af Amer: >60 mL/min (ref >60)
Glucose, Bld: 95 mg/dL (ref 70–99)
Potassium: 3.4 mmol/L — ABNORMAL LOW (ref 3.5–5.1)
Sodium: 141 mmol/L (ref 135–145)
Total Bilirubin: 0.5 mg/dL (ref 0.3–1.2)
Total Protein: 5.9 g/dL — ABNORMAL LOW (ref 6.5–8.1)

## 2019-03-03 LAB — CBC
HCT: 39.9 % (ref 36.0–46.0)
Hemoglobin: 12.6 g/dL (ref 12.0–15.0)
MCH: 25.7 pg — ABNORMAL LOW (ref 26.0–34.0)
MCHC: 31.6 g/dL (ref 30.0–36.0)
MCV: 81.3 fL (ref 80.0–100.0)
Platelets: 232 10*3/uL (ref 150–400)
RBC: 4.91 MIL/uL (ref 3.87–5.11)
RDW: 16.2 % — ABNORMAL HIGH (ref 11.5–15.5)
WBC: 10.3 10*3/uL (ref 4.0–10.5)
nRBC: 0 % (ref 0.0–0.2)

## 2019-03-03 LAB — SURGICAL PCR SCREEN
MRSA, PCR: NEGATIVE
Staphylococcus aureus: NEGATIVE

## 2019-03-03 LAB — POCT PREGNANCY, URINE: Preg Test, Ur: NEGATIVE

## 2019-03-03 LAB — LIPASE, BLOOD: Lipase: 36 U/L (ref 11–51)

## 2019-03-03 LAB — SARS CORONAVIRUS 2 (TAT 6-24 HRS): SARS Coronavirus 2: NEGATIVE

## 2019-03-03 LAB — HIV ANTIBODY (ROUTINE TESTING W REFLEX): HIV Screen 4th Generation wRfx: NONREACTIVE

## 2019-03-03 SURGERY — LAPAROSCOPIC CHOLECYSTECTOMY
Anesthesia: General | Site: Abdomen

## 2019-03-03 MED ORDER — DEXAMETHASONE SODIUM PHOSPHATE 10 MG/ML IJ SOLN
INTRAMUSCULAR | Status: DC | PRN
  Administered 2019-03-03: 5 mg via INTRAVENOUS

## 2019-03-03 MED ORDER — HYDROMORPHONE HCL 1 MG/ML IJ SOLN
0.5000 mg | INTRAMUSCULAR | Status: DC | PRN
  Administered 2019-03-03: 13:00:00 0.5 mg via INTRAVENOUS
  Filled 2019-03-03: qty 1

## 2019-03-03 MED ORDER — 0.9 % SODIUM CHLORIDE (POUR BTL) OPTIME
TOPICAL | Status: DC | PRN
  Administered 2019-03-03: 1000 mL

## 2019-03-03 MED ORDER — ONDANSETRON HCL 4 MG/2ML IJ SOLN
INTRAMUSCULAR | Status: DC | PRN
  Administered 2019-03-03: 4 mg via INTRAVENOUS

## 2019-03-03 MED ORDER — ACETAMINOPHEN 650 MG RE SUPP: 650.0000 mg | Freq: Four times a day (QID) | RECTAL | Status: DC | PRN

## 2019-03-03 MED ORDER — METOPROLOL TARTRATE 5 MG/5ML IV SOLN: 5.0000 mg | Freq: Four times a day (QID) | INTRAVENOUS | Status: DC | PRN

## 2019-03-03 MED ORDER — OXYCODONE HCL 5 MG PO TABS: 5.0000 mg | ORAL_TABLET | Freq: Four times a day (QID) | ORAL | Status: DC | PRN

## 2019-03-03 MED ORDER — ACETAMINOPHEN 500 MG PO TABS
1000.0000 mg | ORAL_TABLET | Freq: Four times a day (QID) | ORAL | Status: DC
  Administered 2019-03-03: 12:00:00 1000 mg via ORAL
  Filled 2019-03-03: qty 2

## 2019-03-03 MED ORDER — ONDANSETRON HCL 4 MG/2ML IJ SOLN
INTRAMUSCULAR | Status: AC
  Filled 2019-03-03: qty 2

## 2019-03-03 MED ORDER — BUPIVACAINE HCL (PF) 0.25 % IJ SOLN
INTRAMUSCULAR | Status: AC
  Filled 2019-03-03: qty 30

## 2019-03-03 MED ORDER — PROPOFOL 10 MG/ML IV BOLUS
INTRAVENOUS | Status: DC | PRN
  Administered 2019-03-03: 150 mg via INTRAVENOUS

## 2019-03-03 MED ORDER — ROCURONIUM BROMIDE 10 MG/ML (PF) SYRINGE
PREFILLED_SYRINGE | INTRAVENOUS | Status: AC
  Filled 2019-03-03: qty 10

## 2019-03-03 MED ORDER — TRAMADOL HCL 50 MG PO TABS: 50.0000 mg | ORAL_TABLET | Freq: Four times a day (QID) | ORAL | 0 refills | Status: DC | PRN

## 2019-03-03 MED ORDER — MIDAZOLAM HCL 2 MG/2ML IJ SOLN
INTRAMUSCULAR | Status: DC | PRN
  Administered 2019-03-03: 2 mg via INTRAVENOUS

## 2019-03-03 MED ORDER — SODIUM CHLORIDE 0.9 % IR SOLN
Status: DC | PRN
  Administered 2019-03-03: 1000 mL

## 2019-03-03 MED ORDER — SIMETHICONE 80 MG PO CHEW: 80.0000 mg | CHEWABLE_TABLET | Freq: Four times a day (QID) | ORAL | Status: DC | PRN

## 2019-03-03 MED ORDER — DEXAMETHASONE SODIUM PHOSPHATE 10 MG/ML IJ SOLN
INTRAMUSCULAR | Status: AC
  Filled 2019-03-03: qty 1

## 2019-03-03 MED ORDER — TRAMADOL HCL 50 MG PO TABS
50.0000 mg | ORAL_TABLET | Freq: Four times a day (QID) | ORAL | Status: DC | PRN
  Administered 2019-03-03: 16:00:00 50 mg via ORAL
  Filled 2019-03-03: qty 1

## 2019-03-03 MED ORDER — KCL IN DEXTROSE-NACL 20-5-0.45 MEQ/L-%-% IV SOLN
INTRAVENOUS | Status: DC
  Administered 2019-03-03 (×2): via INTRAVENOUS
  Filled 2019-03-03 (×2): qty 1000

## 2019-03-03 MED ORDER — ONDANSETRON HCL 4 MG/2ML IJ SOLN: 4.0000 mg | Freq: Four times a day (QID) | INTRAMUSCULAR | Status: DC | PRN

## 2019-03-03 MED ORDER — ACETAMINOPHEN 500 MG PO TABS: 1000.0000 mg | ORAL_TABLET | Freq: Four times a day (QID) | ORAL | Status: DC | PRN

## 2019-03-03 MED ORDER — MORPHINE SULFATE (PF) 2 MG/ML IV SOLN
2.0000 mg | INTRAVENOUS | Status: DC | PRN
  Administered 2019-03-03 (×3): 2 mg via INTRAVENOUS
  Filled 2019-03-03 (×3): qty 1

## 2019-03-03 MED ORDER — LIDOCAINE 2% (20 MG/ML) 5 ML SYRINGE
INTRAMUSCULAR | Status: AC
  Filled 2019-03-03: qty 5

## 2019-03-03 MED ORDER — SUGAMMADEX SODIUM 200 MG/2ML IV SOLN
INTRAVENOUS | Status: DC | PRN
  Administered 2019-03-03: 276.8 mg via INTRAVENOUS

## 2019-03-03 MED ORDER — MIDAZOLAM HCL 2 MG/2ML IJ SOLN
INTRAMUSCULAR | Status: AC
  Filled 2019-03-03: qty 2

## 2019-03-03 MED ORDER — SODIUM CHLORIDE 0.9 % IV SOLN
2.0000 g | INTRAVENOUS | Status: DC
  Administered 2019-03-03: 06:00:00 2 g via INTRAVENOUS
  Filled 2019-03-03: qty 20
  Filled 2019-03-03: qty 2

## 2019-03-03 MED ORDER — POLYETHYLENE GLYCOL 3350 17 G PO PACK: 17.0000 g | PACK | Freq: Every day | ORAL | Status: DC | PRN

## 2019-03-03 MED ORDER — FENTANYL CITRATE (PF) 250 MCG/5ML IJ SOLN
INTRAMUSCULAR | Status: DC | PRN
  Administered 2019-03-03: 50 ug via INTRAVENOUS
  Administered 2019-03-03: 100 ug via INTRAVENOUS
  Administered 2019-03-03 (×2): 50 ug via INTRAVENOUS

## 2019-03-03 MED ORDER — ONDANSETRON 4 MG PO TBDP: 4.0000 mg | ORAL_TABLET | Freq: Four times a day (QID) | ORAL | Status: DC | PRN

## 2019-03-03 MED ORDER — ACETAMINOPHEN 325 MG PO TABS: 650.0000 mg | ORAL_TABLET | Freq: Four times a day (QID) | ORAL | Status: DC | PRN

## 2019-03-03 MED ORDER — LIDOCAINE 2% (20 MG/ML) 5 ML SYRINGE
INTRAMUSCULAR | Status: DC | PRN
  Administered 2019-03-03: 60 mg via INTRAVENOUS

## 2019-03-03 MED ORDER — FENTANYL CITRATE (PF) 250 MCG/5ML IJ SOLN
INTRAMUSCULAR | Status: AC
  Filled 2019-03-03: qty 5

## 2019-03-03 MED ORDER — FENTANYL CITRATE (PF) 100 MCG/2ML IJ SOLN: 25.0000 ug | INTRAMUSCULAR | Status: DC | PRN

## 2019-03-03 MED ORDER — ROCURONIUM BROMIDE 10 MG/ML (PF) SYRINGE
PREFILLED_SYRINGE | INTRAVENOUS | Status: DC | PRN
  Administered 2019-03-03: 60 mg via INTRAVENOUS

## 2019-03-03 MED ORDER — PROPOFOL 10 MG/ML IV BOLUS
INTRAVENOUS | Status: AC
  Filled 2019-03-03: qty 40

## 2019-03-03 SURGICAL SUPPLY — 35 items
APPLIER CLIP 5 13 M/L LIGAMAX5 (MISCELLANEOUS) ×3
BLADE CLIPPER SURG (BLADE) IMPLANT
CANISTER SUCT 3000ML PPV (MISCELLANEOUS) ×3 IMPLANT
CHLORAPREP W/TINT 26 (MISCELLANEOUS) ×3 IMPLANT
CLIP APPLIE 5 13 M/L LIGAMAX5 (MISCELLANEOUS) ×1 IMPLANT
COVER SURGICAL LIGHT HANDLE (MISCELLANEOUS) ×3 IMPLANT
COVER WAND RF STERILE (DRAPES) ×3 IMPLANT
DERMABOND ADVANCED (GAUZE/BANDAGES/DRESSINGS) ×2
DERMABOND ADVANCED .7 DNX12 (GAUZE/BANDAGES/DRESSINGS) ×1 IMPLANT
ELECT REM PT RETURN 9FT ADLT (ELECTROSURGICAL) ×3
ELECTRODE REM PT RTRN 9FT ADLT (ELECTROSURGICAL) ×1 IMPLANT
GLOVE BIO SURGEON STRL SZ 6 (GLOVE) ×3 IMPLANT
GLOVE INDICATOR 6.5 STRL GRN (GLOVE) ×3 IMPLANT
GOWN STRL REUS W/ TWL LRG LVL3 (GOWN DISPOSABLE) ×3 IMPLANT
GOWN STRL REUS W/TWL LRG LVL3 (GOWN DISPOSABLE) ×6
GRASPER SUT TROCAR 14GX15 (MISCELLANEOUS) ×3 IMPLANT
KIT BASIN OR (CUSTOM PROCEDURE TRAY) ×3 IMPLANT
KIT TURNOVER KIT B (KITS) ×3 IMPLANT
NDL INSUFFLATION 14GA 120MM (NEEDLE) ×1 IMPLANT
NEEDLE INSUFFLATION 14GA 120MM (NEEDLE) ×3 IMPLANT
NS IRRIG 1000ML POUR BTL (IV SOLUTION) ×3 IMPLANT
PAD ARMBOARD 7.5X6 YLW CONV (MISCELLANEOUS) ×3 IMPLANT
POUCH SPECIMEN RETRIEVAL 10MM (ENDOMECHANICALS) ×3 IMPLANT
SCISSORS LAP 5X35 DISP (ENDOMECHANICALS) ×3 IMPLANT
SET IRRIG TUBING LAPAROSCOPIC (IRRIGATION / IRRIGATOR) ×3 IMPLANT
SET TUBE SMOKE EVAC HIGH FLOW (TUBING) ×3 IMPLANT
SLEEVE ENDOPATH XCEL 5M (ENDOMECHANICALS) ×6 IMPLANT
SPECIMEN JAR SMALL (MISCELLANEOUS) ×3 IMPLANT
SUT MNCRL AB 4-0 PS2 18 (SUTURE) ×3 IMPLANT
TOWEL GREEN STERILE (TOWEL DISPOSABLE) IMPLANT
TOWEL GREEN STERILE FF (TOWEL DISPOSABLE) ×3 IMPLANT
TRAY LAPAROSCOPIC MC (CUSTOM PROCEDURE TRAY) ×3 IMPLANT
TROCAR XCEL NON-BLD 11X100MML (ENDOMECHANICALS) ×3 IMPLANT
TROCAR XCEL NON-BLD 5MMX100MML (ENDOMECHANICALS) ×3 IMPLANT
WATER STERILE IRR 1000ML POUR (IV SOLUTION) ×3 IMPLANT

## 2019-03-03 NOTE — Discharge Summary (Signed)
[facility] Discharge Summary   Patient ID: Kimberly Fox MRN: [id number] DOB/AGE: 23-19-1997 23 y.o.  Admit date: 03/02/2019 Discharge date: 03/03/2019  Admitting Diagnosis: Acute cholecystitis  Discharge Diagnosis Patient Active Problem List   Diagnosis Date Noted  . Acute cholecystitis 03/03/2019  . 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 Abdominal pain 03/03/2019  . Vaginal delivery 02/12/2019  . Normal labor 02/10/2019  . Group B streptococcal infection during pregnancy 02/10/2019    Consultants None  Imaging: Dg Abd 2 Views  Result Date: 03/02/2019 CLINICAL DATA:  Right upper quadrant pain EXAM: ABDOMEN - 2 VIEW COMPARISON:  None. FINDINGS: The bowel gas pattern is normal. There is no evidence of free air. No radio-opaque calculi or other significant radiographic abnormality is seen. IMPRESSION: Negative. Electronically Signed   By: Rolm Baptise M.D.   On: 03/02/2019 23:41   US Abdomen Limited Ruq  Result Date: 03/03/2019 CLINICAL DATA:  23 year old female with right upper quadrant abdominal pain. EXAM: ULTRASOUND ABDOMEN LIMITED RIGHT UPPER QUADRANT COMPARISON:  Abdominal radiograph dated 03/02/2019 FINDINGS: Gallbladder: The gallbladder is filled with sludge and stones. There is no gallbladder wall thickening or pericholecystic fluid. Positive sonographic Murphy's sign reported. Common bile duct: Diameter: 2 mm Liver: No focal lesion identified. Within normal limits in parenchymal echogenicity. Portal vein is patent on color Doppler imaging with normal direction of blood flow towards the liver. Other: None. IMPRESSION: Cholelithiasis with equivocal findings for acute cholecystitis. A hepatobiliary scintigraphy may provide better evaluation of the gallbladder if there is a high clinical concern for acute cholecystitis . Electronically Signed   By: Anner Crete M.D.   On: 03/03/2019 00:23    Procedures Dr. Kae Heller (03/03/19) - Laparoscopic  Cholecystectomy  Hospital Course:  Patient is a 23 year old female 3 weeks postpartum who presented to [facility] with abdominal pain.  Workup showed acute cholecystitis.  Patient was admitted and underwent procedure listed above.  Tolerated procedure well and was transferred to the floor.  Diet was advanced as tolerated.  On POD#0, the patient was voiding well, tolerating diet, ambulating well, pain well controlled, vital signs stable, incisions c/d/i and felt stable for discharge home.  Patient will follow up in our office in 2 weeks and knows to call with questions or concerns. She will call to confirm appointment date/time.    Physical Exam: General:  Alert, NAD, pleasant, comfortable Abd:  Soft, ND, mild tenderness, incisions C/D/I  Allergies as of 03/03/2019   No Known Allergies     Medication List    TAKE these medications   acetaminophen 500 MG tablet Commonly known as: TYLENOL Take 2 tablets (1,000 mg total) by mouth every 6 (six) hours as needed for mild pain.   ibuprofen 800 MG tablet Commonly known as: ADVIL Take 1 tablet (800 mg total) by mouth 3 (three) times daily.   prenatal vitamin w/FE, FA 27-1 MG Tabs tablet Take 1 tablet by mouth daily at 12 noon.   simethicone 80 MG chewable tablet Commonly known as: Gas-X Chew 1 tablet (80 mg total) by mouth every 6 (six) hours as needed for flatulence.   traMADol 50 MG tablet Commonly known as: ULTRAM Take 1 tablet (50 mg total) by mouth every 6 (six) hours as needed for moderate pain.        Follow-up Information    Surgery, [place]. Go on 03/18/2019.   Specialty: General Surgery Why: Follow up appointment scheduled for 9:15 AM. Please arrive 30 min prior to appointment time. Bring  photo ID and insurance information.  Contact information: [address] [place] [id number] [phone]           Signed: Wells GuilesKelly Rayburn, [facility] 03/03/2019, 2:39 PM Pager:  [phone] Consults: [phone]

## 2019-03-03 NOTE — Progress Notes (Signed)
US at bedside

## 2019-03-03 NOTE — Progress Notes (Signed)
Lab just called the result of Covid test is negative.  Will notify MD.

## 2019-03-03 NOTE — Anesthesia Procedure Notes (Signed)
Procedure Name: Intubation Date/Time: 03/03/2019 9:41 AM Performed by: Alain Marion, CRNA Pre-anesthesia Checklist: Patient identified, Emergency Drugs available, Suction available and Patient being monitored Patient Re-evaluated:Patient Re-evaluated prior to induction Oxygen Delivery Method: Circle System Utilized Preoxygenation: Pre-oxygenation with 100% oxygen Induction Type: IV induction Ventilation: Mask ventilation without difficulty Laryngoscope Size: Miller and 2 Grade View: Grade I Tube type: Oral Number of attempts: 1 Airway Equipment and Method: Stylet and Oral airway Placement Confirmation: ETT inserted through vocal cords under direct vision,  positive ETCO2 and breath sounds checked- equal and bilateral Secured at: 19 cm Tube secured with: Tape Dental Injury: Teeth and Oropharynx as per pre-operative assessment

## 2019-03-03 NOTE — Discharge Instructions (Signed)
[facility] LAPAROSCOPIC SURGERY: POST OP INSTRUCTIONS Always review your discharge instruction sheet given to you by the facility where your surgery was performed. IF YOU HAVE DISABILITY OR FAMILY LEAVE FORMS, YOU MUST BRING THEM TO THE OFFICE FOR PROCESSING.   DO NOT GIVE THEM TO YOUR DOCTOR.  PAIN CONTROL  1. First take acetaminophen (Tylenol) AND/or ibuprofen (Advil) to control your pain after surgery.  Follow directions on package.  Taking acetaminophen (Tylenol) and/or ibuprofen (Advil) regularly after surgery will help to control your pain and lower the amount of prescription pain medication you may need.  You should not take more than 3,000 mg (3 grams) of acetaminophen (Tylenol) in 24 hours.  You should not take ibuprofen (Advil), aleve, motrin, naprosyn or other NSAIDS if you have a history of stomach ulcers or chronic kidney disease.  2. A prescription for pain medication may be given to you upon discharge.  Take your pain medication as prescribed, if you still have uncontrolled pain after taking acetaminophen (Tylenol) or ibuprofen (Advil). 3. Use ice packs to help control pain. 4. If you need a refill on your pain medication, please contact your pharmacy.  They will contact our office to request authorization. Prescriptions will not be filled after 5pm or on week-ends.  HOME MEDICATIONS 5. Take your usually prescribed medications unless otherwise directed.  DIET 6. You should follow a light diet the first few days after arrival home.  Be sure to include lots of fluids daily. Avoid fatty, fried foods.   CONSTIPATION 7. It is common to experience some constipation after surgery and if you are taking pain medication.  Increasing fluid intake and taking a stool softener (such as Colace) will usually help or prevent this problem from occurring.  A mild laxative (Milk of Magnesia or Miralax) should be taken according to package instructions if there are no bowel  movements after 48 hours.  WOUND/INCISION CARE 8. Most patients will experience some swelling and bruising in the area of the incisions.  Ice packs will help.  Swelling and bruising can take several days to resolve.  9. Unless discharge instructions indicate otherwise, follow guidelines below  a. STERI-STRIPS - you may remove your outer bandages 48 hours after surgery, and you may shower at that time.  You have steri-strips (small skin tapes) in place directly over the incision.  These strips should be left on the skin for 7-10 days.   b. DERMABOND/SKIN GLUE - you may shower in 24 hours.  The glue will flake off over the next 2-3 weeks. 10. Any sutures or staples will be removed at the office during your follow-up visit.  ACTIVITIES 11. You may resume regular (light) daily activities beginning the next day--such as daily self-care, walking, climbing stairs--gradually increasing activities as tolerated.  You may have sexual intercourse when it is comfortable.  Refrain from any heavy lifting or straining until approved by your doctor. a. You may drive when you are no longer taking prescription pain medication, you can comfortably wear a seatbelt, and you can safely maneuver your car and apply brakes.  FOLLOW-UP 12. You should see your doctor in the office for a follow-up appointment approximately 2-3 weeks after your surgery.  You should have been given your post-op/follow-up appointment when your surgery was scheduled.  If you did not receive a post-op/follow-up appointment, make sure that you call for this appointment within a day or two after you arrive home to insure a convenient appointment time.     WHEN TO CALL YOUR DOCTOR: 1. Fever over 101.0 2. Inability to urinate 3. Continued bleeding from incision. 4. Increased pain, redness, or drainage from the incision. 5. Increasing abdominal pain  The clinic staff is available to answer your questions during regular business hours.  Please don't  hesitate to call and ask to speak to one of the nurses for clinical concerns.  If you have a medical emergency, go to the nearest emergency room or call 911.  A surgeon from [facility] is always on call at the hospital. [address], [address], [place], [place]  [address] ? [address] [address], [place], [place]   [address] [phone] ? [phone] ? FAX [phone] Web site: [email or web address]  .........   Managing Your Pain After Surgery Without Opioids    Thank you for participating in our program to help patients manage their pain after surgery without opioids. This is part of our effort to provide you with the best care possible, without exposing you or your family to the risk that opioids pose.  What pain can I expect after surgery? You can expect to have some pain after surgery. This is normal. The pain is typically worse the day after surgery, and quickly begins to get better. Many studies have found that many patients are able to manage their pain after surgery with Over-the-Counter (OTC) medications such as Tylenol and Motrin. If you have a condition that does not allow you to take Tylenol or Motrin, notify your surgical team.  How will I manage my pain? The best strategy for controlling your pain after surgery is around the clock pain control with Tylenol (acetaminophen) and Motrin (ibuprofen or Advil). Alternating these medications with each other allows you to maximize your pain control. In addition to Tylenol and Motrin, you can use heating pads or ice packs on your incisions to help reduce your pain.  How will I alternate your regular strength over-the-counter pain medication? You will take a dose of pain medication every three hours. ; Start by taking 650 mg of Tylenol (2 pills of 325 mg) ; 3 hours later take 600 mg of Motrin (3 pills of 200 mg) ; 3 hours after taking the Motrin take 650 mg of Tylenol ; 3 hours after that take 600 mg of  Motrin.   - 1 -  See example - if your first dose of Tylenol is at 12:00 PM   12:00 PM Tylenol 650 mg (2 pills of 325 mg)  3:00 PM Motrin 600 mg (3 pills of 200 mg)  6:00 PM Tylenol 650 mg (2 pills of 325 mg)  9:00 PM Motrin 600 mg (3 pills of 200 mg)  Continue alternating every 3 hours   We recommend that you follow this schedule around-the-clock for at least 3 days after surgery, or until you feel that it is no longer needed. Use the table on the last page of this handout to keep track of the medications you are taking. Important: Do not take more than 3000mg of Tylenol or 3200mg of Motrin in a 24-hour period. Do not take ibuprofen/Motrin if you have a history of bleeding stomach ulcers, severe kidney disease, &/or actively taking a blood thinner  What if I still have pain? If you have pain that is not controlled with the over-the-counter pain medications (Tylenol and Motrin or Advil) you might have what we call "breakthrough" pain. You will receive a prescription for a small amount of an opioid pain medication such as   Oxycodone, Tramadol, or Tylenol with Codeine. Use these opioid pills in the first 24 hours after surgery if you have breakthrough pain. Do not take more than 1 pill every 4-6 hours.  If you still have uncontrolled pain after using all opioid pills, don't hesitate to call our staff using the number provided. We will help make sure you are managing your pain in the best way possible, and if necessary, we can provide a prescription for additional pain medication.   Day 1    Time  Name of Medication Number of pills taken  Amount of Acetaminophen  Pain Level   Comments  AM PM       AM PM       AM PM       AM PM       AM PM       AM PM       AM PM       AM PM       Total Daily amount of Acetaminophen Do not take more than  3,000 mg per day      Day 2    Time  Name of Medication Number of pills taken  Amount of Acetaminophen  Pain Level   Comments  AM  PM       AM PM       AM PM       AM PM       AM PM       AM PM       AM PM       AM PM       Total Daily amount of Acetaminophen Do not take more than  3,000 mg per day      Day 3    Time  Name of Medication Number of pills taken  Amount of Acetaminophen  Pain Level   Comments  AM PM       AM PM       AM PM       AM PM          AM PM       AM PM       AM PM       AM PM       Total Daily amount of Acetaminophen Do not take more than  3,000 mg per day      Day 4    Time  Name of Medication Number of pills taken  Amount of Acetaminophen  Pain Level   Comments  AM PM       AM PM       AM PM       AM PM       AM PM       AM PM       AM PM       AM PM       Total Daily amount of Acetaminophen Do not take more than  3,000 mg per day      Day 5    Time  Name of Medication Number of pills taken  Amount of Acetaminophen  Pain Level   Comments  AM PM       AM PM       AM PM       AM PM       AM PM       AM PM       AM PM         AM PM       Total Daily amount of Acetaminophen Do not take more than  3,000 mg per day       Day 6    Time  Name of Medication Number of pills taken  Amount of Acetaminophen  Pain Level  Comments  AM PM       AM PM       AM PM       AM PM       AM PM       AM PM       AM PM       AM PM       Total Daily amount of Acetaminophen Do not take more than  3,000 mg per day      Day 7    Time  Name of Medication Number of pills taken  Amount of Acetaminophen  Pain Level   Comments  AM PM       AM PM       AM PM       AM PM       AM PM       AM PM       AM PM       AM PM       Total Daily amount of Acetaminophen Do not take more than  3,000 mg per day        For additional information about how and where to safely dispose of unused opioid medications - https://www.morepowerfulnc.org  Disclaimer: This document contains information and/or instructional materials adapted from [facility]  for the typical patient with your condition. It does not replace medical advice from your health care provider because your experience may differ from that of the typical patient. Talk to your health care provider if you have any questions about this document, your condition or your treatment plan. Adapted from [facility]  

## 2019-03-03 NOTE — Anesthesia Preprocedure Evaluation (Addendum)
Anesthesia Evaluation  Patient identified by MRN, date of birth, ID band  Reviewed: Allergy & Precautions, NPO status , Patient's Chart, lab work & pertinent test results  Airway Mallampati: II  TM Distance: >3 FB Neck ROM: Full    Dental   Upper and lower braces:   Pulmonary neg pulmonary ROS,    Pulmonary exam normal breath sounds clear to auscultation       Cardiovascular negative cardio ROS Normal cardiovascular exam Rhythm:Regular Rate:Normal     Neuro/Psych negative neurological ROS  negative psych ROS   GI/Hepatic negative GI ROS, Neg liver ROS,   Endo/Other  negative endocrine ROS  Renal/GU negative Renal ROS  negative genitourinary   Musculoskeletal negative musculoskeletal ROS (+)   Abdominal   Peds  Hematology negative hematology ROS (+)   Anesthesia Other Findings   Reproductive/Obstetrics                            Anesthesia Physical Anesthesia Plan  ASA: I  Anesthesia Plan: General   Post-op Pain Management:    Induction: Intravenous  PONV Risk Score and Plan: 3 and Midazolam, Dexamethasone and Ondansetron  Airway Management Planned: Oral ETT  Additional Equipment:   Intra-op Plan:   Post-operative Plan: Extubation in OR  Informed Consent: I have reviewed the patients History and Physical, chart, labs and discussed the procedure including the risks, benefits and alternatives for the proposed anesthesia with the patient or authorized representative who has indicated his/her understanding and acceptance.     Dental advisory given  Plan Discussed with: CRNA  Anesthesia Plan Comments:         Anesthesia Quick Evaluation

## 2019-03-03 NOTE — Transfer of Care (Signed)
Immediate Anesthesia Transfer of Care Note  Patient: Kimberly Fox  Procedure(s) Performed: LAPAROSCOPIC CHOLECYSTECTOMY (N/A Abdomen)  Patient Location: PACU  Anesthesia Type:General  Level of Consciousness: awake, alert  and oriented  Airway & Oxygen Therapy: Patient Spontanous Breathing and Patient connected to face mask oxygen  Post-op Assessment: Report given to RN and Post -op Vital signs reviewed and stable  Post vital signs: Reviewed and stable  Last Vitals:  Vitals Value Taken Time  BP 112/81 03/03/19 1045  Temp    Pulse 85 03/03/19 1048  Resp 24 03/03/19 1048  SpO2 99 % 03/03/19 1048  Vitals shown include unvalidated device data.  Last Pain:  Vitals:   03/03/19 0804  TempSrc:   PainSc: 0-No pain      Patients Stated Pain Goal: 0 (62/69/48 5462)  Complications: No apparent anesthesia complications

## 2019-03-03 NOTE — Op Note (Signed)
Operative Note  Kimberly Fox 22 y.o. female [id number]  03/03/2019  Surgeon: Clovis Riley MD FACS  Assistant: Brigid Re PA-C  Procedure performed: Laparoscopic Cholecystectomy  Procedure classification: urgent  Preop diagnosis: cholecystitis Post-op diagnosis/intraop findings: same  Specimens: gallbladder  Retained items: none  EBL: minimal  Complications: none  Description of procedure: After obtaining informed consent the patient was brought to the operating room. She is on standing antibiotics. SCD's were applied. General endotracheal anesthesia was initiated and a formal time-out was performed. The abdomen was prepped and draped in the usual sterile fashion and the abdomen was entered using an infraumbilical veress needle after instilling the site with local. Insufflation to 46mmHg was obtained, 19mm trocar and camera inserted, and gross inspection revealed no evidence of injury from our entry or other intraabdominal abnormalities. Two 61mm trocars were introduced in the right midclavicular and right anterior axillary lines under direct visualization and following infiltration with local. An 30mm trocar was placed in the epigastrium. The gallbladder was retracted cephalad and the infundibulum was retracted laterally. A combination of hook electrocautery and blunt dissection was utilized to clear the peritoneum from the neck and cystic duct, circumferentially isolating the cystic artery and cystic duct and lifting the gallbladder from the cystic plate. There is a large stone impacted at the junction of the cystic duct and gallbladder neck. The critical view of safety was achieved with the cystic artery, cystic duct, and liver bed visualized between them with no other structures. The artery was clipped with a single clip proximally and distally and divided as was the cystic duct with three clips on the proximal end. The gallbladder was dissected from the liver plate using  electrocautery. Once freed the gallbladder was placed in an endocatch bag and removed through the epigastric trocar site. A small amount of bleeding on the liver bed was controlled with cautery. Some dark, viscous bile (but no stones) had been spilled from the gallbladder during its dissection from the liver bed. This was aspirated and the right upper quadrant was irrigated copiously until the effluent was clear. Hemostasis was once again confirmed, and reinspection of the abdomen revealed no injuries. The clips were well opposed without any bile leak from the duct or the liver bed. The 23mm trocar site in the epigastrium was closed with a 0 vicryl in the fascia under direct visualization using a PMI device. The abdomen was desufflated and all trocars removed. The skin incisions were closed with subcuticular monocryl and Dermabond. The patient was awakened, extubated and transported to the recovery room in stable condition.    All counts were correct at the completion of the case.

## 2019-03-03 NOTE — Interval H&P Note (Signed)
History and Physical Interval Note:  03/03/2019 7:45 AM  Kimberly Fox  has presented today for surgery, with the diagnosis of Cholecystitis.  The various methods of treatment have been discussed with the patient and family. After consideration of risks, benefits and other options for treatment, the patient has consented to  Procedure(s): LAPAROSCOPIC CHOLECYSTECTOMY (N/A) as a surgical intervention.  The patient's history has been reviewed, patient examined, no change in status, stable for surgery.  I have reviewed the patient's chart and labs.  Questions were answered to the patient's satisfaction.     Mikiyah Glasner Rich Brave

## 2019-03-03 NOTE — MAU Note (Signed)
Per patient placement [facility] patient can go to [phone]. Sharron RN notified.

## 2019-03-03 NOTE — H&P (Signed)
Reason for Consult: abdominal pain  Kimberly Fox is an 23 y.o. female.  HPI: 23 yo female who is 1 month post partum. She has had 2 days of abdominal pain. Pain is epigastric and right upper quadrant. It started in her upper back and moved to the abdomen. She did not have similar symptoms during pregnancy. She denies nausea or vomiting. She denies diarrhea or fevers.  Past Medical History:  Diagnosis Date  . Medical history non-contributory     Past Surgical History:  Procedure Laterality Date  . WISDOM TOOTH EXTRACTION      No family history on file.  Social History:  reports that she has never smoked. She has never used smokeless tobacco. She reports that she does not drink alcohol or use drugs.  Allergies: No Known Allergies  Medications: I have reviewed the patient's current medications.  Results for orders placed or performed during the hospital encounter of 03/02/19 (from the past 48 hour(s))  CBC     Status: Abnormal   Collection Time: 03/02/19 11:00 PM  Result Value Ref Range   WBC 8.7 4.0 - 10.5 K/uL   RBC 5.35 (H) 3.87 - 5.11 MIL/uL   Hemoglobin 13.9 12.0 - 15.0 g/dL   HCT 16.143.2 09.636.0 - 04.546.0 %   MCV 80.7 80.0 - 100.0 fL   MCH 26.0 26.0 - 34.0 pg   MCHC 32.2 30.0 - 36.0 g/dL   RDW 40.916.3 (H) 81.111.5 - 91.415.5 %   Platelets 250 150 - 400 K/uL   nRBC 0.0 0.0 - 0.2 %    Comment: Performed at [facility], [address] [place], [place] [address]  Comprehensive metabolic panel     Status: Abnormal   Collection Time: 03/02/19 11:00 PM  Result Value Ref Range   Sodium 140 135 - 145 mmol/L   Potassium 3.7 3.5 - 5.1 mmol/L   Chloride 107 98 - 111 mmol/L   CO2 24 22 - 32 mmol/L   Glucose, Bld 100 (H) 70 - 99 mg/dL   BUN 9 6 - 20 mg/dL   Creatinine, Ser 6.210.60 0.44 - 1.00 mg/dL   Calcium 9.0 8.9 - 30.810.3 mg/dL   Total Protein 6.5 6.5 - 8.1 g/dL   Albumin 3.6 3.5 - 5.0 g/dL   AST 22 15 - 41 U/L   ALT 18 0 - 44 U/L   Alkaline Phosphatase 142 (H) 38 - 126 U/L   Total  Bilirubin 0.2 (L) 0.3 - 1.2 mg/dL   GFR calc non Af Amer >60 >60 mL/min   GFR calc Af Amer >60 >60 mL/min   Anion gap 9 5 - 15    Comment: Performed at [facility], [address] [place], [place] [address]  Type and screen [facility]     Status: None   Collection Time: 03/02/19 11:00 PM  Result Value Ref Range   ABO/RH(D) B POS    Antibody Screen NEG    Sample Expiration      03/05/2019,2359 Performed at [facility], [address] [place], [place] [address]   Lipase, blood     Status: None   Collection Time: 03/02/19 11:00 PM  Result Value Ref Range   Lipase 36 11 - 51 U/L    Comment: Performed at [facility], [address] [place], [place] [address]  SARS CORONAVIRUS 2 Nasal Swab Aptima Multi Swab     Status: None   Collection Time: 03/03/19  12:44 AM   Specimen: Aptima Multi Swab; Nasal Swab  Result Value Ref Range   SARS Coronavirus 2 NEGATIVE NEGATIVE    Comment: (NOTE) SARS-CoV-2 target nucleic acids are NOT DETECTED. The SARS-CoV-2 RNA is generally detectable in upper and lower respiratory specimens during the acute phase of infection. Negative results do not preclude SARS-CoV-2 infection, do not rule out co-infections with other pathogens, and should not be used as the sole basis for treatment or other patient management decisions. Negative results must be combined with clinical observations, patient history, and epidemiological information. The expected result is Negative. Fact Sheet for Patients: HairSlick.nohttps://www.fda.gov/media/138098/download Fact Sheet for Healthcare Providers: quierodirigir.comhttps://www.fda.gov/media/138095/download This test is not yet approved or cleared by the [place] FDA and  has been authorized for detection and/or diagnosis of SARS-CoV-2 by FDA under an Emergency Use Authorization (EUA). This EUA will remain  in effect (meaning this test can be used) for the duration of the COVID-19 declaration under Section 56  4(b)(1) of the Act, 21 U.S.C. section 360bbb-3(b)(1), unless the authorization is terminated or revoked sooner. Performed at [facility], [address] [place], [place] [address]   Surgical pcr screen     Status: None   Collection Time: 03/03/19  4:04 AM   Specimen: Nasal Mucosa; Nasal Swab  Result Value Ref Range   MRSA, PCR NEGATIVE NEGATIVE   Staphylococcus aureus NEGATIVE NEGATIVE    Comment: (NOTE) The Xpert SA Assay (FDA approved for NASAL specimens in patients 23 years of age and older), is one component of a comprehensive surveillance program. It is not intended to diagnose infection nor to guide or monitor treatment. Performed at [facility], [address] [place], [place] [address]   Comprehensive metabolic panel     Status: Abnormal   Collection Time: 03/03/19  4:56 AM  Result Value Ref Range   Sodium 141 135 - 145 mmol/L   Potassium 3.4 (L) 3.5 - 5.1 mmol/L   Chloride 109 98 - 111 mmol/L   CO2 23 22 - 32 mmol/L   Glucose, Bld 95 70 - 99 mg/dL   BUN 8 6 - 20 mg/dL   Creatinine, Ser 6.600.65 0.44 - 1.00 mg/dL   Calcium 8.7 (L) 8.9 - 10.3 mg/dL   Total Protein 5.9 (L) 6.5 - 8.1 g/dL   Albumin 3.1 (L) 3.5 - 5.0 g/dL   AST 50 (H) 15 - 41 U/L   ALT 29 0 - 44 U/L   Alkaline Phosphatase 120 38 - 126 U/L   Total Bilirubin 0.5 0.3 - 1.2 mg/dL   GFR calc non Af Amer >60 >60 mL/min   GFR calc Af Amer >60 >60 mL/min   Anion gap 9 5 - 15    Comment: Performed at [facility], [address] [place], [place] [address]  CBC     Status: Abnormal   Collection Time: 03/03/19  4:56 AM  Result Value Ref Range   WBC 10.3 4.0 - 10.5 K/uL   RBC 4.91 3.87 - 5.11 MIL/uL   Hemoglobin 12.6 12.0 - 15.0 g/dL   HCT 01.039.9 93.236.0 - 35.546.0 %   MCV 81.3 80.0 - 100.0 fL   MCH 25.7 (L) 26.0 - 34.0 pg   MCHC 31.6 30.0 - 36.0 g/dL   RDW 73.216.2 (H) 20.211.5 - 54.215.5 %   Platelets 232 150 - 400 K/uL   nRBC 0.0 0.0 - 0.2 %    Comment: Performed at [facility], [address] [place], [place] [address]    Dg Abd 2 Views  Result Date: 03/02/2019 CLINICAL DATA:  Right upper quadrant pain EXAM: ABDOMEN - 2 VIEW COMPARISON:  None. FINDINGS: The bowel gas pattern is normal. There is no evidence of free air. No radio-opaque calculi or other significant radiographic abnormality is seen. IMPRESSION: Negative. Electronically Signed   By: Rolm Baptise M.D.   On: 03/02/2019 23:41   US Abdomen Limited Ruq  Result Date: 03/03/2019 CLINICAL DATA:  23 year old female with right upper quadrant abdominal pain. EXAM: ULTRASOUND ABDOMEN LIMITED RIGHT UPPER QUADRANT COMPARISON:  Abdominal radiograph dated 03/02/2019 FINDINGS: Gallbladder: The gallbladder is filled with sludge and stones. There is no gallbladder wall thickening or pericholecystic fluid. Positive sonographic Murphy's sign reported. Common bile duct: Diameter: 2 mm Liver: No focal lesion identified. Within normal limits in parenchymal echogenicity. Portal vein is patent on color Doppler imaging with normal direction of blood flow towards the liver. Other: None. IMPRESSION: Cholelithiasis with equivocal findings for acute cholecystitis. A hepatobiliary scintigraphy may provide better evaluation of the gallbladder if there is a high clinical concern for acute cholecystitis . Electronically Signed   By: Anner Crete M.D.   On: 03/03/2019 00:23    Review of Systems  Constitutional: Negative for chills and fever.  HENT: Negative for hearing loss.   Eyes: Negative for blurred vision and double vision.  Respiratory: Negative for cough and hemoptysis.   Cardiovascular: Negative for chest pain and palpitations.  Gastrointestinal: Positive for abdominal pain. Negative for nausea and vomiting.  Genitourinary: Negative for dysuria and urgency.  Musculoskeletal: Negative for myalgias and neck pain.  Skin: Negative for itching and rash.  Neurological: Negative for dizziness, tingling and headaches.  Endo/Heme/Allergies: Does not  bruise/bleed easily.  Psychiatric/Behavioral: Negative for depression and suicidal ideas.   Blood pressure 111/67, pulse 72, temperature 98.6 F (37 C), temperature source Oral, resp. rate 18, height 5' (1.524 m), weight 69.2 kg, SpO2 100 %, unknown if currently breastfeeding. Physical Exam  Vitals reviewed. Constitutional: She is oriented to person, place, and time. She appears well-developed and well-nourished.  HENT:  Head: Normocephalic and atraumatic.  Eyes: Pupils are equal, round, and reactive to light. Conjunctivae and EOM are normal.  Neck: Normal range of motion. Neck supple.  Cardiovascular: Normal rate and regular rhythm.  Respiratory: Effort normal and breath sounds normal.  GI: Soft. Bowel sounds are normal. She exhibits no distension. There is abdominal tenderness in the right upper quadrant and epigastric area.  Musculoskeletal: Normal range of motion.  Neurological: She is alert and oriented to person, place, and time.  Skin: Skin is warm and dry.  Psychiatric: She has a normal mood and affect. Her behavior is normal.      Assessment/Plan: 23 yo female with chronic vs acute cholecystitis -IV abx -place in observation -lap chole later today -We discussed the etiology of her pain, we discussed treatment options and recommended surgery. We discussed details of surgery including general anesthesia, laparoscopic approach, identification of cystic duct and common bile duct. Ligation of cystic duct and cystic artery. Possible need for intraoperative cholangiogram or open procedure. Possible risks of common bile duct injury, liver injury, cystic duct leak, bleeding, infection, post-cholecystectomy syndrome. The patient showed good understanding and all questions were answered   Kimberly Fox 03/03/2019, 6:55 AM

## 2019-03-03 NOTE — MAU Note (Signed)
Mary at bed placement called to try to obtain a bed and she stated that until we have a COVID result she is unable to place the patient.

## 2019-03-03 NOTE — Progress Notes (Signed)
Kimberly Fox to be D/C'd  per MD order. Discussed with the patient and all questions fully answered.  VSS, Skin clean, dry and intact without evidence of skin break down, no evidence of skin tears noted.  IV catheter discontinued intact. Site without signs and symptoms of complications. Dressing and pressure applied.  An After Visit Summary was printed and given to the patient. Patient received prescription.  D/c education completed with patient/family including follow up instructions, medication list, d/c activities limitations if indicated, with other d/c instructions as indicated by MD - patient able to verbalize understanding, all questions fully answered.   Patient instructed to return to ED, call 911, or call MD for any changes in condition.   Patient to be escorted via [place], and D/C home via private auto.

## 2019-03-03 NOTE — Progress Notes (Signed)
MD is awaiting results of patient's Covid test that was taken by Ander Purpura, RN at the [facility] location before transfer to [phone].  Spoke with Acquanetta Sit this morning in the lab [phone].  The machine that does the rapid 2 hour Covid test is down.  The sample will have to go to the other machine in the psyche area that takes 18 to 24 hrs for results.  Will notify MD.

## 2019-03-04 ENCOUNTER — Encounter (HOSPITAL_COMMUNITY): Payer: Self-pay | Admitting: Surgery

## 2019-03-04 NOTE — Anesthesia Postprocedure Evaluation (Signed)
Anesthesia Post Note  Patient: Kimberly Fox  Procedure(s) Performed: LAPAROSCOPIC CHOLECYSTECTOMY (N/A Abdomen)     Patient location during evaluation: PACU Anesthesia Type: General Level of consciousness: awake and alert Pain management: pain level controlled Vital Signs Assessment: post-procedure vital signs reviewed and stable Respiratory status: spontaneous breathing, nonlabored ventilation, respiratory function stable and patient connected to nasal cannula oxygen Cardiovascular status: blood pressure returned to baseline and stable Postop Assessment: no apparent nausea or vomiting Anesthetic complications: no    Last Vitals:  Vitals:   03/03/19 1127 03/03/19 1147  BP: 128/60 119/65  Pulse: 71 63  Resp: 14 16  Temp: 37 C 36.7 C  SpO2: 100% 100%    Last Pain:  Vitals:   03/03/19 1547  TempSrc:   PainSc: 8    Pain Goal: Patients Stated Pain Goal: 3 (03/03/19 1206)                 Kerstie Agent L Quincy Boy

## 2019-07-11 NOTE — L&D Delivery Note (Signed)
OB/GYN Faculty Practice Delivery Note  Kimberly Fox is a 23 y.o. G2P1001 s/p vaginal delivery at [redacted]w[redacted]d. She was admitted for spontaneous onset of labor.   ROM: 0h 23m with clear fluid GBS Status: positive; adequate ampicillin prior to delivery Maximum Maternal Temperature: 98.81F  Labor Progress: Pt arrived in latent labor and progressed to complete cervical dilation s/p AROM for clear fluid. She then had an uncomplicated delivery s/p a brief second stage as noted below.  Delivery Date/Time: 07/04/20 at 0926 Delivery: Called to room and patient was complete and pushing. Head delivered LOA. No nuchal cord present. Shoulder and body delivered in usual fashion. Infant with spontaneous cry, placed on mother's abdomen, dried and stimulated. Cord clamped x 2 after 1-minute delay, and cut by FOB under my direct supervision. Cord blood drawn. Placenta delivered spontaneously with gentle cord traction. Fundus firm with massage and Pitocin. Labia, perineum, vagina, and cervix were inspected, without evidence of lacerations.   Placenta: 3-vessel cord, intact with trailing membranes, sent to L&D Complications: none Lacerations: none EBL: 106 ml Analgesia: none  Infant: female  APGARs 8 & 9  weight per medical record  Lynnda Shields, MD OB/GYN Fellow, Faculty Practice

## 2019-12-23 ENCOUNTER — Other Ambulatory Visit: Payer: Self-pay

## 2019-12-23 ENCOUNTER — Ambulatory Visit (INDEPENDENT_AMBULATORY_CARE_PROVIDER_SITE_OTHER): Payer: Self-pay | Admitting: General Practice

## 2019-12-23 DIAGNOSIS — Z3687 Encounter for antenatal screening for uncertain dates: Secondary | ICD-10-CM

## 2019-12-23 DIAGNOSIS — Z3201 Encounter for pregnancy test, result positive: Secondary | ICD-10-CM

## 2019-12-23 LAB — POCT PREGNANCY, URINE: Preg Test, Ur: POSITIVE — AB

## 2019-12-23 NOTE — Progress Notes (Addendum)
Patient presents to office today for UPT. UPT +. Patient reports first positive home test 5/15. Patient has unsure LMP- could have been sometime in February or March, history of irregular periods. Patient reports taking only PNV. She does endorse nausea/vomiting but declines medication at this time. Discussed Vitamin B6 BID, ginger teas/chews, and dietary recommendations. Patient will have U/S scheduled to establish dating.   Chase Caller RN BSN 12/23/19   Chart reviewed for nurse visit. Agree with plan of care.   Currie Paris, NP 12/23/2019 5:10 PM

## 2019-12-31 ENCOUNTER — Ambulatory Visit
Admission: RE | Admit: 2019-12-31 | Discharge: 2019-12-31 | Disposition: A | Discharge status: Home / Self Care | Payer: Self-pay | Source: Ambulatory Visit | Attending: Nurse Practitioner | Admitting: Nurse Practitioner

## 2019-12-31 ENCOUNTER — Other Ambulatory Visit: Payer: Self-pay

## 2019-12-31 ENCOUNTER — Other Ambulatory Visit: Payer: Self-pay | Admitting: Nurse Practitioner

## 2019-12-31 DIAGNOSIS — Z3687 Encounter for antenatal screening for uncertain dates: Secondary | ICD-10-CM

## 2019-12-31 DIAGNOSIS — Z3201 Encounter for pregnancy test, result positive: Secondary | ICD-10-CM | POA: Insufficient documentation

## 2020-02-04 ENCOUNTER — Other Ambulatory Visit: Payer: Self-pay

## 2020-02-04 ENCOUNTER — Inpatient Hospital Stay (HOSPITAL_COMMUNITY)
Admission: AD | Admit: 2020-02-04 | Discharge: 2020-02-04 | Disposition: A | Discharge status: Home / Self Care | Payer: Self-pay | Attending: Obstetrics and Gynecology | Admitting: Obstetrics and Gynecology

## 2020-02-04 ENCOUNTER — Encounter (HOSPITAL_COMMUNITY): Payer: Self-pay | Admitting: Obstetrics and Gynecology

## 2020-02-04 ENCOUNTER — Inpatient Hospital Stay (HOSPITAL_COMMUNITY): Payer: Self-pay

## 2020-02-04 DIAGNOSIS — Z20822 Contact with and (suspected) exposure to covid-19: Secondary | ICD-10-CM | POA: Insufficient documentation

## 2020-02-04 DIAGNOSIS — M549 Dorsalgia, unspecified: Secondary | ICD-10-CM | POA: Insufficient documentation

## 2020-02-04 DIAGNOSIS — O26899 Other specified pregnancy related conditions, unspecified trimester: Secondary | ICD-10-CM

## 2020-02-04 DIAGNOSIS — O219 Vomiting of pregnancy, unspecified: Secondary | ICD-10-CM | POA: Insufficient documentation

## 2020-02-04 DIAGNOSIS — R1031 Right lower quadrant pain: Secondary | ICD-10-CM | POA: Insufficient documentation

## 2020-02-04 DIAGNOSIS — R519 Headache, unspecified: Secondary | ICD-10-CM | POA: Insufficient documentation

## 2020-02-04 DIAGNOSIS — Z3A17 17 weeks gestation of pregnancy: Secondary | ICD-10-CM | POA: Insufficient documentation

## 2020-02-04 DIAGNOSIS — R109 Unspecified abdominal pain: Secondary | ICD-10-CM

## 2020-02-04 DIAGNOSIS — O26892 Other specified pregnancy related conditions, second trimester: Secondary | ICD-10-CM | POA: Insufficient documentation

## 2020-02-04 LAB — CBC WITH DIFFERENTIAL/PLATELET
Abs Immature Granulocytes: 0.05 10*3/uL (ref 0.00–0.07)
Basophils Absolute: 0 10*3/uL (ref 0.0–0.1)
Basophils Relative: 0 %
Eosinophils Absolute: 0.1 10*3/uL (ref 0.0–0.5)
Eosinophils Relative: 1 %
HCT: 40.3 % (ref 36.0–46.0)
Hemoglobin: 13.1 g/dL (ref 12.0–15.0)
Immature Granulocytes: 1 %
Lymphocytes Relative: 25 %
Lymphs Abs: 2.3 10*3/uL (ref 0.7–4.0)
MCH: 28.2 pg (ref 26.0–34.0)
MCHC: 32.5 g/dL (ref 30.0–36.0)
MCV: 86.7 fL (ref 80.0–100.0)
Monocytes Absolute: 0.5 10*3/uL (ref 0.1–1.0)
Monocytes Relative: 5 %
Neutro Abs: 6.5 10*3/uL (ref 1.7–7.7)
Neutrophils Relative %: 68 %
Platelets: 226 10*3/uL (ref 150–400)
RBC: 4.65 MIL/uL (ref 3.87–5.11)
RDW: 13.8 % (ref 11.5–15.5)
WBC: 9.4 10*3/uL (ref 4.0–10.5)
nRBC: 0 % (ref 0.0–0.2)

## 2020-02-04 LAB — URINALYSIS, ROUTINE W REFLEX MICROSCOPIC
Bilirubin Urine: NEGATIVE
Glucose, UA: NEGATIVE mg/dL
Ketones, ur: NEGATIVE mg/dL
Leukocytes,Ua: NEGATIVE
Nitrite: NEGATIVE
Protein, ur: NEGATIVE mg/dL
Specific Gravity, Urine: 1.024 (ref 1.005–1.030)
pH: 5 (ref 5.0–8.0)

## 2020-02-04 LAB — COMPREHENSIVE METABOLIC PANEL
ALT: 13 U/L (ref 0–44)
AST: 14 U/L — ABNORMAL LOW (ref 15–41)
Albumin: 3 g/dL — ABNORMAL LOW (ref 3.5–5.0)
Alkaline Phosphatase: 65 U/L (ref 38–126)
Anion gap: 10 (ref 5–15)
BUN: 6 mg/dL (ref 6–20)
CO2: 22 mmol/L (ref 22–32)
Calcium: 9.2 mg/dL (ref 8.9–10.3)
Chloride: 105 mmol/L (ref 98–111)
Creatinine, Ser: 0.43 mg/dL — ABNORMAL LOW (ref 0.44–1.00)
GFR calc Af Amer: >60 mL/min (ref >60)
GFR calc non Af Amer: >60 mL/min (ref >60)
Glucose, Bld: 81 mg/dL (ref 70–99)
Potassium: 3.7 mmol/L (ref 3.5–5.1)
Sodium: 137 mmol/L (ref 135–145)
Total Bilirubin: 0.3 mg/dL (ref 0.3–1.2)
Total Protein: 6.5 g/dL (ref 6.5–8.1)

## 2020-02-04 LAB — TYPE AND SCREEN
ABO/RH(D): B POS
Antibody Screen: NEGATIVE

## 2020-02-04 LAB — SARS CORONAVIRUS 2 BY RT PCR (HOSPITAL ORDER, PERFORMED IN ~~LOC~~ HOSPITAL LAB): SARS Coronavirus 2: NEGATIVE

## 2020-02-04 MED ORDER — SIMETHICONE 80 MG PO CHEW
80.0000 mg | CHEWABLE_TABLET | Freq: Four times a day (QID) | ORAL | 1 refills | Status: DC | PRN
Start: 2020-02-04 — End: 2020-02-17

## 2020-02-04 MED ORDER — MORPHINE SULFATE (PF) 4 MG/ML IV SOLN: 2.0000 mg | INTRAVENOUS | Status: DC | PRN

## 2020-02-04 MED ORDER — SODIUM CHLORIDE 0.9 % IV SOLN
8.0000 mg | Freq: Once | INTRAVENOUS | Status: DC
  Filled 2020-02-04: qty 4

## 2020-02-04 MED ORDER — SIMETHICONE 80 MG PO CHEW
80.0000 mg | CHEWABLE_TABLET | Freq: Four times a day (QID) | ORAL | 0 refills | Status: DC | PRN
Start: 2020-02-04 — End: 2020-02-17

## 2020-02-04 MED ORDER — ONDANSETRON 4 MG PO TBDP
8.0000 mg | ORAL_TABLET | Freq: Once | ORAL | Status: AC
  Administered 2020-02-04: 8 mg via ORAL
  Filled 2020-02-04: qty 2

## 2020-02-04 MED ORDER — OXYCODONE-ACETAMINOPHEN 5-325 MG PO TABS
2.0000 | ORAL_TABLET | ORAL | Status: DC | PRN
  Administered 2020-02-04: 2 via ORAL
  Filled 2020-02-04: qty 2

## 2020-02-04 MED ORDER — HYDROMORPHONE HCL 1 MG/ML IJ SOLN: 1.0000 mg | INTRAMUSCULAR | Status: DC | PRN

## 2020-02-04 MED ORDER — HYDROMORPHONE HCL 1 MG/ML IJ SOLN
INTRAMUSCULAR | Status: AC
  Administered 2020-02-04: 1 mg via INTRAVENOUS
  Filled 2020-02-04: qty 1

## 2020-02-04 MED ORDER — IOHEXOL 300 MG/ML  SOLN
100.0000 mL | Freq: Once | INTRAMUSCULAR | Status: AC | PRN
  Administered 2020-02-04: 100 mL via INTRAVENOUS

## 2020-02-04 MED ORDER — ONDANSETRON 8 MG PO TBDP
8.0000 mg | ORAL_TABLET | Freq: Three times a day (TID) | ORAL | 0 refills | Status: DC | PRN
Start: 2020-02-04 — End: 2020-06-16

## 2020-02-04 MED ORDER — CYCLOBENZAPRINE HCL 10 MG PO TABS: 10.0000 mg | ORAL_TABLET | Freq: Two times a day (BID) | ORAL | 0 refills | Status: DC | PRN

## 2020-02-04 MED ORDER — LACTATED RINGERS IV BOLUS
1000.0000 mL | Freq: Once | INTRAVENOUS | Status: AC
  Administered 2020-02-04: 1000 mL via INTRAVENOUS

## 2020-02-04 MED ORDER — MORPHINE SULFATE (PF) 4 MG/ML IV SOLN
INTRAVENOUS | Status: AC
  Administered 2020-02-04: 2 mg via INTRAVENOUS
  Filled 2020-02-04: qty 1

## 2020-02-04 MED ORDER — DOCUSATE SODIUM 100 MG PO CAPS: 100.0000 mg | ORAL_CAPSULE | Freq: Three times a day (TID) | ORAL | 0 refills | Status: DC

## 2020-02-04 NOTE — Discharge Instructions (Signed)
Abdominal Pain During Pregnancy  Belly (abdominal) pain is common during pregnancy. There are many possible causes. Most of the time, it is not a serious problem. Other times, it can be a sign that something is wrong with the pregnancy. Always tell your doctor if you have belly pain. Follow these instructions at home:  Do not have sex or put anything in your vagina until your pain goes away completely.  Get plenty of rest until your pain gets better.  Drink enough fluid to keep your pee (urine) pale yellow.  Take over-the-counter and prescription medicines only as told by your doctor.  Keep all follow-up visits as told by your doctor. This is important. Contact a doctor if:  Your pain continues or gets worse after resting.  You have lower belly pain that: ? Comes and goes at regular times. ? Spreads to your back. ? Feels like menstrual cramps.  You have pain or burning when you pee (urinate). Get help right away if:  You have a fever or chills.  You have vaginal bleeding.  You are leaking fluid from your vagina.  You are passing tissue from your vagina.  You throw up (vomit) for more than 24 hours.  You have watery poop (diarrhea) for more than 24 hours.  Your baby is moving less than usual.  You feel very weak or faint.  You have shortness of breath.  You have very bad pain in your upper belly. Summary  Belly (abdominal) pain is common during pregnancy. There are many possible causes.  If you have belly pain during pregnancy, tell your doctor right away.  Keep all follow-up visits as told by your doctor. This is important. This information is not intended to replace advice given to you by your health care provider. Make sure you discuss any questions you have with your health care provider. Document Revised: 10/14/2018 Document Reviewed: 09/28/2016 Elsevier Patient Education  2020 Elsevier Inc.  

## 2020-02-04 NOTE — MAU Provider Note (Addendum)
Patient Kimberly Fox is a 24 y.o. G2P1001  at [redacted]w[redacted]d here with complaints of right lower quadrant pain.   She denies fever, sweating, shaking, chills.  She endorses HA, diarrhea  and vomiting.   She denies vaginal bleeding, discharge. Prior to this, she endorses constipation. Has had problems with constipation in the past.   History     CSN: [id number]  Arrival date and time: 02/04/20 2426   None     Chief Complaint  Patient presents with  . Abdominal Pain  . Emesis  . Back Pain   Emesis  This is a new problem. The current episode started today. The problem occurs 2 to 4 times per day. There has been no fever. Associated symptoms include abdominal pain and headaches. Pertinent negatives include no diarrhea or fever.  Headache  The current episode started today. The problem occurs intermittently. The pain is at a severity of 6/10. Associated symptoms include abdominal pain and vomiting. Pertinent negatives include no fever.  Abdominal Pain This is a new problem. The current episode started in the past 7 days. The onset quality is sudden. The pain is located in the RLQ. Pain radiation: radiates to her back and front of stomach. Associated symptoms include headaches and vomiting. Pertinent negatives include no constipation, diarrhea, dysuria or fever. The pain is aggravated by movement.    OB History    Gravida  2   Para  1   Term  1   Preterm      AB      Living  1     SAB      TAB      Ectopic      Multiple  0   Live Births  1           Past Medical History:  Diagnosis Date  . Medical history non-contributory     Past Surgical History:  Procedure Laterality Date  . CHOLECYSTECTOMY N/A 03/03/2019   Procedure: LAPAROSCOPIC CHOLECYSTECTOMY;  Surgeon: Berna Bue, MD;  Location: [facility] OR;  Service: General;  Laterality: N/A;  . WISDOM TOOTH EXTRACTION      History reviewed. No pertinent family history.  Social History   Tobacco Use  .  Smoking status: Never Smoker  . Smokeless tobacco: Never Used  Substance Use Topics  . Alcohol use: No  . Drug use: No    Allergies: No Known Allergies  Medications Prior to Admission  Medication Sig Dispense Refill Last Dose  . prenatal vitamin w/FE, FA (PRENATAL 1 + 1) 27-1 MG TABS tablet Take 1 tablet by mouth daily at 12 noon.   02/04/2020 at Unknown time  . acetaminophen (TYLENOL) 500 MG tablet Take 2 tablets (1,000 mg total) by mouth every 6 (six) hours as needed for mild pain.     Marland Kitchen ibuprofen (ADVIL) 800 MG tablet Take 1 tablet (800 mg total) by mouth 3 (three) times daily. (Patient not taking: Reported on 03/03/2019) 30 tablet 0   . simethicone (GAS-X) 80 MG chewable tablet Chew 1 tablet (80 mg total) by mouth every 6 (six) hours as needed for flatulence.     . traMADol (ULTRAM) 50 MG tablet Take 1 tablet (50 mg total) by mouth every 6 (six) hours as needed for moderate pain. 20 tablet 0     Review of Systems  Constitutional: Negative for fever.  Gastrointestinal: Positive for abdominal pain and vomiting. Negative for constipation and diarrhea.  Genitourinary: Negative for dysuria.  Neurological: Positive for  headaches.   Physical Exam   Blood pressure (!) 123/59, pulse 87, temperature 98.4 F (36.9 C), resp. rate 16, height 5' (1.524 m), weight 70.8 kg, SpO2 99 %, unknown if currently breastfeeding.  Physical Exam Constitutional:      Appearance: She is well-developed.  Abdominal:     General: Abdomen is flat. There is abdominal bruit.     Palpations: Abdomen is soft.     Tenderness: There is abdominal tenderness in the right lower quadrant. Positive signs include McBurney's sign.  Neurological:     Mental Status: She is alert.     MAU Course  Procedures  MDM -will do [place], CT to check for appendicitis and torsion, both of which are urgent conditions -will offer percocet for pain -COVID test negative   -[place] is benign, no evidence of torsion.   -1444: waiting for  CT, patient crying out in pain, will give dilaudid 1 mg IV,  -1612: patient CT results show normal appendix, normal kidneys.   1730: Patient has been sleeping in MAU; when roused she reports her pain is a "much less". She thinks she is able to go home now. She is requesting anti-nausea medicine.   Husband reports that patient has had back pain and abdominal pain in the past.   FHR is 149  Patient Vitals for the past 24 hrs:  BP Temp Temp src Pulse Resp SpO2 Height Weight  02/04/20 1744 (!) 107/51 -- -- 91 16 100 % -- --  02/04/20 1507 (!) 136/61 -- -- 97 -- -- -- --  02/04/20 1447 (!) 141/66 97.8 F (36.6 C) Oral 97 22 -- -- --  02/04/20 1027 (!) 123/59 98.4 F (36.9 C) -- 87 16 99 % 5' (1.524 m) 70.8 kg     Assessment and Plan   1. [redacted] weeks gestation of pregnancy   2. Abdominal pain affecting pregnancy    -Patient stable for discharge with RX for simethicone, zofran, Flexeril -She will keep OB appt next [facility]  -Return to MAU if her condition worsens or changes.    Charlesetta Garibaldi Pierre Dellarocco 02/04/2020, 10:47 AM

## 2020-02-04 NOTE — MAU Note (Addendum)
.   Kimberly Fox is a 24 y.o. at [redacted]w[redacted]d here in MAU reporting:lower back and abdominal pain that started this morning with vomiting. Denies any VB or abnormal discharge. Pt states she has her first OB appointment next week Onset of complaint: this morning Pain score: 7 Vitals:   02/04/20 1027  BP: (!) 123/59  Pulse: 87  Resp: 16  Temp: 98.4 F (36.9 C)  SpO2: 99%     FHT:149 Lab orders placed from triage: UA

## 2020-02-17 ENCOUNTER — Encounter: Payer: Self-pay | Admitting: Obstetrics and Gynecology

## 2020-02-17 ENCOUNTER — Ambulatory Visit (INDEPENDENT_AMBULATORY_CARE_PROVIDER_SITE_OTHER): Payer: Self-pay | Admitting: Obstetrics and Gynecology

## 2020-02-17 ENCOUNTER — Other Ambulatory Visit: Payer: Self-pay

## 2020-02-17 VITALS — BP 107/73 | HR 87 | Wt 153.7 lb

## 2020-02-17 DIAGNOSIS — Z3490 Encounter for supervision of normal pregnancy, unspecified, unspecified trimester: Secondary | ICD-10-CM

## 2020-02-17 DIAGNOSIS — Z3A19 19 weeks gestation of pregnancy: Secondary | ICD-10-CM

## 2020-02-17 LAB — POCT URINALYSIS DIP (DEVICE)
Bilirubin Urine: NEGATIVE
Glucose, UA: NEGATIVE mg/dL
Ketones, ur: NEGATIVE mg/dL
Leukocytes,Ua: NEGATIVE
Nitrite: NEGATIVE
Protein, ur: NEGATIVE mg/dL
Specific Gravity, Urine: 1.025 (ref 1.005–1.030)
Urobilinogen, UA: 0.2 mg/dL (ref 0.0–1.0)
pH: 7.5 (ref 5.0–8.0)

## 2020-02-17 NOTE — Progress Notes (Signed)
PRENATAL VISIT NOTE  Subjective:  Kimberly Fox is a 24 y.o. G2P1001 at [redacted]w[redacted]d by -/12 being seen today for ongoing prenatal care.  She is currently monitored for the following issues for this low-risk pregnancy and has Normal labor; Group B streptococcal infection during pregnancy; Vaginal delivery; Acute cholecystitis; Chronic calculous cholecystitis; Abdominal pain; Supervision of low-risk pregnancy; and [redacted] weeks gestation of pregnancy on their problem list.  Patient reports nausea and vomiting.  Contractions: Not present. Vag. Bleeding: None.  Movement: Present. Denies leaking of fluid.   Pt seen in [facility] on 7/28 for concern of RLQ pain. CT Abd Pelvis and [place] were negative for appendicitis and torsion. Pt reports some loss of appetite given nausea and increased satiety. Also endorses emesis 2-3x daily. Using candy lozenges for nausea with good benefit. She also zofran at home.   Endorses "little flutters". No prior issues with prior pregnancy (term NSVD). First child is 15 year old. This pregnancy unplanned but desired.  No current or prior mood/safety concerns. Last [facility] on 07/22/18 was negative during last pregnancy (confirmed on Care Everywhere). Has not yet received the COVID vaccine. No personal or family history of HTN or DM. No ASA in prior pregnancy. Currently working at call center. Plan to breast feed. Undecided for birth control--previously used Depo and OCPs.  The following portions of the patient's history were reviewed and updated as appropriate: allergies, current medications, past family history, past medical history, past social history, past surgical history and problem list.   Objective:   Vitals:   02/17/20 1014  BP: 107/73  Pulse: 87  Weight: 153 lb 11.2 oz (69.7 kg)    Fetal Status: Fetal Heart Rate (bpm): 151   Movement: Present     General:  Alert, oriented and cooperative. Patient is in no acute distress.  Skin: Skin is warm and dry. No rash  noted.   Cardiovascular: Normal heart rate noted  Respiratory: Normal respiratory effort, no problems with respiration noted  Abdomen: Soft, gravid, appropriate for gestational age.  Pain/Pressure: Present     Pelvic: Cervical exam deferred        Extremities: Normal range of motion.  Edema: None  Mental Status: Normal mood and affect. Normal behavior. Normal judgment and thought content.   Assessment and Plan:  Pregnancy: G2P1001 at [redacted]w[redacted]d  1. Encounter for supervision of low-risk pregnancy, [redacted] weeks GA: Pt presents for new prenatal appt today. Prior US confirmed IUP at [redacted] weeks GA. No complications with prior pregnancy and delivery. No red flag symptoms and +FHTs today. - instructed daily prenatal vitamin - CHL AMB BABYSCRIPTS SCHEDULE OPTIMIZATION - Genetic Screening - US MFM OB COMP + 14 WK - Culture, OB Urine - Cytology - PAP( [place]) - CBC/D/Plt+RPR+Rh+ABO+Rub Ab... - plan for f/u prenatal appt in 4 weeks or sooner for return precautions (plan to re-address birth control--pt currently undecided)  2. Abdominal Pain, Resolved: Pt recently seen in [facility]U on 7/28 given concern of RLQ pain. Reassuringly CT Abd Pelvis and Pelvic US both wnl. Pt reports complete resolution of symptoms today. No longer taking flexeril, zofran and simethicone prescribed on 7/28.  Preterm labor symptoms and general obstetric precautions including but not limited to vaginal bleeding, contractions, leaking of fluid and fetal movement were reviewed in detail with the patient. Please refer to After Visit Summary for other counseling recommendations.   Return in about 4 weeks (around 03/16/2020) for in-person OB, any provider.  Future Appointments  Date Time Provider  Department Center  02/26/2020  2:45 PM [facility] US4 [facility]  03/16/2020  1:15 PM Bernerd Limbo, CNM [facility]    Sheila Oats, MD  OB Fellow, Faculty [facility]

## 2020-02-17 NOTE — Progress Notes (Signed)
Pt provided with office BP cuff.  Pt explained how to place BP readings into Babys[facility]pts app.

## 2020-02-17 NOTE — Patient Instructions (Addendum)
It was a pleasure to meet you today! It will be very important to take your prenatal vitamin daily. Please return to clinic in 4 weeks or sooner if concerns as noted below. We will also schedule your anatomy scan ultrasound today. We will contact you with the results of your prenatal labs and genetic screening.  Second Trimester of Pregnancy  The second trimester is from week 14 through week 27 (month 4 through 6). This is often the time in pregnancy that you feel your best. Often times, morning sickness has lessened or quit. You may have more energy, and you may get hungry more often. Your unborn baby is growing rapidly. At the end of the sixth month, he or she is about 9 inches long and weighs about 1 pounds. You will likely feel the baby move between 18 and 20 weeks of pregnancy. Follow these instructions at home: Medicines  Take over-the-counter and prescription medicines only as told by your doctor. Some medicines are safe and some medicines are not safe during pregnancy.  Take a prenatal vitamin that contains at least 600 micrograms (mcg) of folic acid.  If you have trouble pooping (constipation), take medicine that will make your stool soft (stool softener) if your doctor approves. Eating and drinking   Eat regular, healthy meals.  Avoid raw meat and uncooked cheese.  If you get low calcium from the food you eat, talk to your doctor about taking a daily calcium supplement.  Avoid foods that are high in fat and sugars, such as fried and sweet foods.  If you feel sick to your stomach (nauseous) or throw up (vomit): ? Eat 4 or 5 small meals a day instead of 3 large meals. ? Try eating a few soda crackers. ? Drink liquids between meals instead of during meals.  To prevent constipation: ? Eat foods that are high in fiber, like fresh fruits and vegetables, whole grains, and beans. ? Drink enough fluids to keep your pee (urine) clear or pale yellow. Activity  Exercise only as  told by your doctor. Stop exercising if you start to have cramps.  Do not exercise if it is too hot, too humid, or if you are in a place of great height (high altitude).  Avoid heavy lifting.  Wear low-heeled shoes. Sit and stand up straight.  You can continue to have sex unless your doctor tells you not to. Relieving pain and discomfort  Wear a good support bra if your breasts are tender.  Take warm water baths (sitz baths) to soothe pain or discomfort caused by hemorrhoids. Use hemorrhoid cream if your doctor approves.  Rest with your legs raised if you have leg cramps or low back pain.  If you develop puffy, bulging veins (varicose veins) in your legs: ? Wear support hose or compression stockings as told by your doctor. ? Raise (elevate) your feet for 15 minutes, 3-4 times a day. ? Limit salt in your food. Prenatal care  Write down your questions. Take them to your prenatal visits.  Keep all your prenatal visits as told by your doctor. This is important. Safety  Wear your seat belt when driving.  Make a list of emergency phone numbers, including numbers for family, friends, the hospital, and police and fire departments. General instructions  Ask your doctor about the right foods to eat or for help finding a counselor, if you need these services.  Ask your doctor about local prenatal classes. Begin classes before month 6 of your pregnancy.  Do not use hot tubs, steam rooms, or saunas.  Do not douche or use tampons or scented sanitary pads.  Do not cross your legs for long periods of time.  Visit your dentist if you have not done so. Use a soft toothbrush to brush your teeth. Floss gently.  Avoid all smoking, herbs, and alcohol. Avoid drugs that are not approved by your doctor.  Do not use any products that contain nicotine or tobacco, such as cigarettes and e-cigarettes. If you need help quitting, ask your doctor.  Avoid cat litter boxes and soil used by cats. These  carry germs that can cause birth defects in the baby and can cause a loss of your baby (miscarriage) or stillbirth. Contact a doctor if:  You have mild cramps or pressure in your lower belly.  You have pain when you pee (urinate).  You have bad smelling fluid coming from your vagina.  You continue to feel sick to your stomach (nauseous), throw up (vomit), or have watery poop (diarrhea).  You have a nagging pain in your belly area.  You feel dizzy. Get help right away if:  You have a fever.  You are leaking fluid from your vagina.  You have spotting or bleeding from your vagina.  You have severe belly cramping or pain.  You lose or gain weight rapidly.  You have trouble catching your breath and have chest pain.  You notice sudden or extreme puffiness (swelling) of your face, hands, ankles, feet, or legs.  You have not felt the baby move in over an hour.  You have severe headaches that do not go away when you take medicine.  You have trouble seeing. Summary  The second trimester is from week 14 through week 27 (months 4 through 6). This is often the time in pregnancy that you feel your best.  To take care of yourself and your unborn baby, you will need to eat healthy meals, take medicines only if your doctor tells you to do so, and do activities that are safe for you and your baby.  Call your doctor if you get sick or if you notice anything unusual about your pregnancy. Also, call your doctor if you need help with the right food to eat, or if you want to know what activities are safe for you. This information is not intended to replace advice given to you by your health care provider. Make sure you discuss any questions you have with your health care provider. Document Revised: 10/18/2018 Document Reviewed: 08/01/2016 Elsevier Patient Education  2020 [employer].

## 2020-02-18 ENCOUNTER — Encounter: Payer: Self-pay | Admitting: Obstetrics and Gynecology

## 2020-02-18 LAB — CBC/D/PLT+RPR+RH+ABO+RUB AB...
Antibody Screen: NEGATIVE
Basophils Absolute: 0 10*3/uL (ref 0.0–0.2)
Basos: 0 %
EOS (ABSOLUTE): 0.1 10*3/uL (ref 0.0–0.4)
Eos: 1 %
HCV Ab: 0.2 s/co ratio (ref 0.0–0.9)
HIV Screen 4th Generation wRfx: NONREACTIVE
Hematocrit: 42.2 % (ref 34.0–46.6)
Hemoglobin: 13.7 g/dL (ref 11.1–15.9)
Hepatitis B Surface Ag: NEGATIVE
Immature Grans (Abs): 0 10*3/uL (ref 0.0–0.1)
Immature Granulocytes: 0 %
Lymphocytes Absolute: 1.6 10*3/uL (ref 0.7–3.1)
Lymphs: 18 %
MCH: 28.2 pg (ref 26.6–33.0)
MCHC: 32.5 g/dL (ref 31.5–35.7)
MCV: 87 fL (ref 79–97)
Monocytes Absolute: 0.4 10*3/uL (ref 0.1–0.9)
Monocytes: 4 %
Neutrophils Absolute: 6.8 10*3/uL (ref 1.4–7.0)
Neutrophils: 77 %
Platelets: 227 10*3/uL (ref 150–450)
RBC: 4.86 x10E6/uL (ref 3.77–5.28)
RDW: 13.4 % (ref 11.7–15.4)
RPR Ser Ql: NONREACTIVE
Rh Factor: POSITIVE
Rubella Antibodies, IGG: 1.45 index (ref >0.99)
WBC: 8.9 10*3/uL (ref 3.4–10.8)

## 2020-02-18 LAB — HCV INTERPRETATION

## 2020-02-23 LAB — URINE CULTURE, OB REFLEX

## 2020-02-23 LAB — CULTURE, OB URINE

## 2020-02-26 ENCOUNTER — Ambulatory Visit: Payer: Self-pay | Attending: Obstetrics and Gynecology

## 2020-02-26 ENCOUNTER — Other Ambulatory Visit: Payer: Self-pay

## 2020-02-26 DIAGNOSIS — Z363 Encounter for antenatal screening for malformations: Secondary | ICD-10-CM

## 2020-02-26 DIAGNOSIS — Z3490 Encounter for supervision of normal pregnancy, unspecified, unspecified trimester: Secondary | ICD-10-CM | POA: Insufficient documentation

## 2020-02-26 DIAGNOSIS — Z3A20 20 weeks gestation of pregnancy: Secondary | ICD-10-CM

## 2020-02-27 ENCOUNTER — Other Ambulatory Visit: Payer: Self-pay | Admitting: *Deleted

## 2020-02-27 ENCOUNTER — Telehealth: Payer: Self-pay | Admitting: Obstetrics and Gynecology

## 2020-02-27 DIAGNOSIS — Z362 Encounter for other antenatal screening follow-up: Secondary | ICD-10-CM

## 2020-02-27 DIAGNOSIS — R8271 Bacteriuria: Secondary | ICD-10-CM

## 2020-02-27 MED ORDER — CEPHALEXIN 250 MG PO CAPS: 250.0000 mg | ORAL_CAPSULE | Freq: Four times a day (QID) | ORAL | 0 refills | Status: AC

## 2020-02-27 NOTE — Telephone Encounter (Signed)
Called pt regarding result of GBS in urine culture. Sent keflex 250mg  QID x5d to pt's preferred pharmacy. Pt reports understanding of treatment plan. No additional questions at this time.

## 2020-03-09 ENCOUNTER — Encounter: Payer: Self-pay | Admitting: *Deleted

## 2020-03-11 ENCOUNTER — Encounter: Payer: Self-pay | Admitting: General Practice

## 2020-03-16 ENCOUNTER — Ambulatory Visit (INDEPENDENT_AMBULATORY_CARE_PROVIDER_SITE_OTHER): Payer: Self-pay | Admitting: Certified Nurse Midwife

## 2020-03-16 ENCOUNTER — Other Ambulatory Visit: Payer: Self-pay

## 2020-03-16 VITALS — BP 125/60 | HR 105 | Wt 155.0 lb

## 2020-03-16 DIAGNOSIS — Z3A23 23 weeks gestation of pregnancy: Secondary | ICD-10-CM

## 2020-03-16 DIAGNOSIS — Z3402 Encounter for supervision of normal first pregnancy, second trimester: Secondary | ICD-10-CM

## 2020-03-16 NOTE — Progress Notes (Signed)
   PRENATAL VISIT NOTE  Subjective:  Kimberly Fox is a 24 y.o. G2P1001 at [redacted]w[redacted]d being seen today for ongoing prenatal care.  She is currently monitored for the following issues for this low-risk pregnancy and has Normal labor; History of laparoscopic cholecystectomy; Chronic calculous cholecystitis; Abdominal pain; Supervision of low-risk pregnancy; and [redacted] weeks gestation of pregnancy on their problem list.  Patient reports some leg pain when walking and swelling at the end of the day, it resolves when she puts her feet up.  Contractions: Not present. Vag. Bleeding: None.  Movement: Present. Denies leaking of fluid.   The following portions of the patient's history were reviewed and updated as appropriate: allergies, current medications, past family history, past medical history, past social history, past surgical history and problem list.   Objective:   Vitals:   03/16/20 1327  BP: 125/60  Pulse: (!) 105  Weight: 155 lb (70.3 kg)    Fetal Status: Fetal Heart Rate (bpm): 145 Fundal Height: 23 cm Movement: Present     General:  Alert, oriented and cooperative. Patient is in no acute distress.  Skin: Skin is warm and dry. No rash noted.   Cardiovascular: Normal heart rate noted  Respiratory: Normal respiratory effort, no problems with respiration noted  Abdomen: Soft, gravid, appropriate for gestational age.  Pain/Pressure: Absent     Pelvic: Cervical exam deferred        Extremities: Normal range of motion.  Edema: None  Mental Status: Normal mood and affect. Normal behavior. Normal judgment and thought content.   Assessment and Plan:  Pregnancy: G2P1001 at [redacted]w[redacted]d 1. Encounter for supervision of normal first pregnancy in second trimester - Encouraged daily stretching and adequate water/protein intake  2. [redacted] weeks gestation of pregnancy - Gave anticipatory guidance re next visit and GTT  Preterm labor symptoms and general obstetric precautions including but not limited  to vaginal bleeding, contractions, leaking of fluid and fetal movement were reviewed in detail with the patient. Please refer to After Visit Summary for other counseling recommendations.   Return in about 5 weeks (around 04/20/2020) for LOB w GTT.  Future Appointments  Date Time Provider Department Center  03/25/2020  3:15 PM [facility] NURSE [facility]  03/25/2020  3:15 PM [facility] US2 [facility]   Edd Arbour, CNM, MSN, [facility] 03/16/20 1:47 PM

## 2020-03-16 NOTE — Patient Instructions (Signed)
Glucose Tolerance Test During Pregnancy Why am I having this test? The glucose tolerance test (GTT) is done to check how your body processes sugar (glucose). This is one of several tests used to diagnose diabetes that develops during pregnancy (gestational diabetes mellitus). Gestational diabetes is a temporary form of diabetes that some women develop during pregnancy. It usually occurs during the second trimester of pregnancy and goes away after delivery. Testing (screening) for gestational diabetes usually occurs between 24 and 28 weeks of pregnancy. You may have the GTT test after having a 1-hour glucose screening test if the results from that test indicate that you may have gestational diabetes. You may also have this test if:  You have a history of gestational diabetes.  You have a history of giving birth to very large babies or have experienced repeated fetal loss (stillbirth).  You have signs and symptoms of diabetes, such as: ? Changes in your vision. ? Tingling or numbness in your hands or feet. ? Changes in hunger, thirst, and urination that are not otherwise explained by your pregnancy. What is being tested? This test measures the amount of glucose in your blood at different times during a period of 3 hours. This indicates how well your body is able to process glucose. What kind of sample is taken?  Blood samples are required for this test. They are usually collected by inserting a needle into a blood vessel. How do I prepare for this test?  For 3 days before your test, eat normally. Have plenty of carbohydrate-rich foods.  Follow instructions from your health care provider about: ? Eating or drinking restrictions on the day of the test. You may be asked to not eat or drink anything other than water (fast) starting 8-10 hours before the test. ? Changing or stopping your regular medicines. Some medicines may interfere with this test. Tell a health care provider about:  All  medicines you are taking, including vitamins, herbs, eye drops, creams, and over-the-counter medicines.  Any blood disorders you have.  Any surgeries you have had.  Any medical conditions you have. What happens during the test? First, your blood glucose will be measured. This is referred to as your fasting blood glucose, since you fasted before the test. Then, you will drink a glucose solution that contains a certain amount of glucose. Your blood glucose will be measured again 1, 2, and 3 hours after drinking the solution. This test takes about 3 hours to complete. You will need to stay at the testing location during this time. During the testing period:  Do not eat or drink anything other than the glucose solution.  Do not exercise.  Do not use any products that contain nicotine or tobacco, such as cigarettes and e-cigarettes. If you need help stopping, ask your health care provider. The testing procedure may vary among health care providers and hospitals. How are the results reported? Your results will be reported as milligrams of glucose per deciliter of blood (mg/dL) or millimoles per liter (mmol/L). Your health care provider will compare your results to normal ranges that were established after testing a large group of people (reference ranges). Reference ranges may vary among labs and hospitals. For this test, common reference ranges are:  Fasting: less than 95-105 mg/dL (5.3-5.8 mmol/L).  1 hour after drinking glucose: less than 180-190 mg/dL (10.0-10.5 mmol/L).  2 hours after drinking glucose: less than 155-165 mg/dL (8.6-9.2 mmol/L).  3 hours after drinking glucose: 140-145 mg/dL (7.8-8.1 mmol/L). What do the   results mean? Results within reference ranges are considered normal, meaning that your glucose levels are well-controlled. If two or more of your blood glucose levels are high, you may be diagnosed with gestational diabetes. If only one level is high, your health care  provider may suggest repeat testing or other tests to confirm a diagnosis. Talk with your health care provider about what your results mean. Questions to ask your health care provider Ask your health care provider, or the department that is doing the test:  When will my results be ready?  How will I get my results?  What are my treatment options?  What other tests do I need?  What are my next steps? Summary  The glucose tolerance test (GTT) is one of several tests used to diagnose diabetes that develops during pregnancy (gestational diabetes mellitus). Gestational diabetes is a temporary form of diabetes that some women develop during pregnancy.  You may have the GTT test after having a 1-hour glucose screening test if the results from that test indicate that you may have gestational diabetes. You may also have this test if you have any symptoms or risk factors for gestational diabetes.  Talk with your health care provider about what your results mean. This information is not intended to replace advice given to you by your health care provider. Make sure you discuss any questions you have with your health care provider. Document Revised: 10/17/2018 Document Reviewed: 02/05/2017 Elsevier Patient Education  2020 Elsevier Inc.  

## 2020-03-25 ENCOUNTER — Ambulatory Visit: Payer: Self-pay | Admitting: *Deleted

## 2020-03-25 ENCOUNTER — Other Ambulatory Visit: Payer: Self-pay

## 2020-03-25 ENCOUNTER — Ambulatory Visit: Payer: Self-pay | Attending: Obstetrics and Gynecology

## 2020-03-25 DIAGNOSIS — Z3A24 24 weeks gestation of pregnancy: Secondary | ICD-10-CM

## 2020-03-25 DIAGNOSIS — Z3490 Encounter for supervision of normal pregnancy, unspecified, unspecified trimester: Secondary | ICD-10-CM

## 2020-03-25 DIAGNOSIS — Z362 Encounter for other antenatal screening follow-up: Secondary | ICD-10-CM | POA: Insufficient documentation

## 2020-04-20 ENCOUNTER — Ambulatory Visit (INDEPENDENT_AMBULATORY_CARE_PROVIDER_SITE_OTHER): Payer: Self-pay | Admitting: Advanced Practice Midwife

## 2020-04-20 ENCOUNTER — Other Ambulatory Visit: Payer: Self-pay

## 2020-04-20 ENCOUNTER — Encounter: Payer: Self-pay | Admitting: Advanced Practice Midwife

## 2020-04-20 VITALS — BP 118/75 | HR 111 | Wt 160.4 lb

## 2020-04-20 DIAGNOSIS — Z3A28 28 weeks gestation of pregnancy: Secondary | ICD-10-CM

## 2020-04-20 DIAGNOSIS — Z3493 Encounter for supervision of normal pregnancy, unspecified, third trimester: Secondary | ICD-10-CM

## 2020-04-20 DIAGNOSIS — Z23 Encounter for immunization: Secondary | ICD-10-CM

## 2020-04-20 LAB — GLUCOSE, CAPILLARY: Glucose-Capillary: 103 mg/dL — ABNORMAL HIGH (ref 70–99)

## 2020-04-20 MED ORDER — COMFORT FIT MATERNITY SUPP MED MISC: 1.0000 | Freq: Every day | 0 refills | Status: DC

## 2020-04-20 NOTE — Patient Instructions (Signed)
Contraception Choices Contraception, also called birth control, refers to methods or devices that prevent pregnancy. Hormonal methods Contraceptive implant  A contraceptive implant is a thin, plastic tube that contains a hormone. It is inserted into the upper part of the arm. It can remain in place for up to 3 years. Progestin-only injections Progestin-only injections are injections of progestin, a synthetic form of the hormone progesterone. They are given every 3 months by a health care provider. Birth control pills  Birth control pills are pills that contain hormones that prevent pregnancy. They must be taken once a day, preferably at the same time each day. Birth control patch  The birth control patch contains hormones that prevent pregnancy. It is placed on the skin and must be changed once a week for three weeks and removed on the fourth week. A prescription is needed to use this method of contraception. Vaginal ring  A vaginal ring contains hormones that prevent pregnancy. It is placed in the vagina for three weeks and removed on the fourth week. After that, the process is repeated with a new ring. A prescription is needed to use this method of contraception. Emergency contraceptive Emergency contraceptives prevent pregnancy after unprotected sex. They come in pill form and can be taken up to 5 days after sex. They work best the sooner they are taken after having sex. Most emergency contraceptives are available without a prescription. This method should not be used as your only form of birth control. Barrier methods Female condom  A female condom is a thin sheath that is worn over the penis during sex. Condoms keep sperm from going inside a woman's body. They can be used with a spermicide to increase their effectiveness. They should be disposed after a single use. Female condom  A female condom is a soft, loose-fitting sheath that is put into the vagina before sex. The condom keeps sperm  from going inside a woman's body. They should be disposed after a single use. Diaphragm  A diaphragm is a soft, dome-shaped barrier. It is inserted into the vagina before sex, along with a spermicide. The diaphragm blocks sperm from entering the uterus, and the spermicide kills sperm. A diaphragm should be left in the vagina for 6-8 hours after sex and removed within 24 hours. A diaphragm is prescribed and fitted by a health care provider. A diaphragm should be replaced every 1-2 years, after giving birth, after gaining more than 15 lb (6.8 kg), and after pelvic surgery. Cervical cap  A cervical cap is a round, soft latex or plastic cup that fits over the cervix. It is inserted into the vagina before sex, along with spermicide. It blocks sperm from entering the uterus. The cap should be left in place for 6-8 hours after sex and removed within 48 hours. A cervical cap must be prescribed and fitted by a health care provider. It should be replaced every 2 years. Sponge  A sponge is a soft, circular piece of polyurethane foam with spermicide on it. The sponge helps block sperm from entering the uterus, and the spermicide kills sperm. To use it, you make it wet and then insert it into the vagina. It should be inserted before sex, left in for at least 6 hours after sex, and removed and thrown away within 30 hours. Spermicides Spermicides are chemicals that kill or block sperm from entering the cervix and uterus. They can come as a cream, jelly, suppository, foam, or tablet. A spermicide should be inserted into the   vagina with an applicator at least 10-15 minutes before sex to allow time for it to work. The process must be repeated every time you have sex. Spermicides do not require a prescription. Intrauterine contraception Intrauterine device (IUD) An IUD is a T-shaped device that is put in a woman's uterus. There are two types:  Hormone IUD.This type contains progestin, a synthetic form of the hormone  progesterone. This type can stay in place for 3-5 years.  Copper IUD.This type is wrapped in copper wire. It can stay in place for 10 years.  Permanent methods of contraception Female tubal ligation In this method, a woman's fallopian tubes are sealed, tied, or blocked during surgery to prevent eggs from traveling to the uterus. Hysteroscopic sterilization In this method, a small, flexible insert is placed into each fallopian tube. The inserts cause scar tissue to form in the fallopian tubes and block them, so sperm cannot reach an egg. The procedure takes about 3 months to be effective. Another form of birth control must be used during those 3 months. Female sterilization This is a procedure to tie off the tubes that carry sperm (vasectomy). After the procedure, the man can still ejaculate fluid (semen). Natural planning methods Natural family planning In this method, a couple does not have sex on days when the woman could become pregnant. Calendar method This means keeping track of the length of each menstrual cycle, identifying the days when pregnancy can happen, and not having sex on those days. Ovulation method In this method, a couple avoids sex during ovulation. Symptothermal method This method involves not having sex during ovulation. The woman typically checks for ovulation by watching changes in her temperature and in the consistency of cervical mucus. Post-ovulation method In this method, a couple waits to have sex until after ovulation. Summary  Contraception, also called birth control, means methods or devices that prevent pregnancy.  Hormonal methods of contraception include implants, injections, pills, patches, vaginal rings, and emergency contraceptives.  Barrier methods of contraception can include female condoms, female condoms, diaphragms, cervical caps, sponges, and spermicides.  There are two types of IUDs (intrauterine devices). An IUD can be put in a woman's uterus to  prevent pregnancy for 3-5 years.  Permanent sterilization can be done through a procedure for males, females, or both.  Natural family planning methods involve not having sex on days when the woman could become pregnant. This information is not intended to replace advice given to you by your health care provider. Make sure you discuss any questions you have with your health care provider. Document Revised: 06/28/2017 Document Reviewed: 07/29/2016 Elsevier Patient Education  2020 Elsevier Inc.  

## 2020-04-20 NOTE — Progress Notes (Signed)
   PRENATAL VISIT NOTE  Subjective:  Kimberly Fox is a 24 y.o. G2P1001 at [redacted]w[redacted]d being seen today for ongoing prenatal care.  She is currently monitored for the following issues for this low-risk pregnancy and has Normal labor; History of laparoscopic cholecystectomy; Chronic calculous cholecystitis; Abdominal pain; Supervision of low-risk pregnancy; and [redacted] weeks gestation of pregnancy on their problem list.  Patient reports no complaints.  Contractions: Not present. Vag. Bleeding: None.  Movement: Present. Denies leaking of fluid.   The following portions of the patient's history were reviewed and updated as appropriate: allergies, current medications, past family history, past medical history, past social history, past surgical history and problem list.   Objective:   Vitals:   04/20/20 1327  BP: 118/75  Pulse: (!) 111  Weight: 160 lb 6.4 oz (72.8 kg)    Fetal Status: Fetal Heart Rate (bpm): 141 Fundal Height: 28 cm Movement: Present     General:  Alert, oriented and cooperative. Patient is in no acute distress.  Skin: Skin is warm and dry. No rash noted.   Cardiovascular: Normal heart rate noted  Respiratory: Normal respiratory effort, no problems with respiration noted  Abdomen: Soft, gravid, appropriate for gestational age.  Pain/Pressure: Present     Pelvic: Cervical exam deferred        Extremities: Normal range of motion.  Edema: None  Mental Status: Normal mood and affect. Normal behavior. Normal judgment and thought content.   Assessment and Plan:  Pregnancy: G2P1001 at [redacted]w[redacted]d 1. Encounter for supervision of low-risk pregnancy in third trimester - Patient was in exam room and came out to tell us she was feeling dizzy. Reports that she has only had a few crackers a little bit of water today. CBG 103 at that time. Patient states that this also happened yesterday. She was also having back pain that comes and goes.   - Dizziness occurred after getting flu and tdap  vaccines. Patient reassure that a combo of not eating much and getting both vaccines at the same time could leave her feeling this way.   - Patient given water and granola bar. She was left to rest in the room until she started feeling better.    2. [redacted] weeks gestation of pregnancy - needs GTT  Preterm labor symptoms and general obstetric precautions including but not limited to vaginal bleeding, contractions, leaking of fluid and fetal movement were reviewed in detail with the patient. Please refer to After Visit Summary for other counseling recommendations.   Return in about 2 weeks (around 05/04/2020) for Patient needs appt ASAP for 2 hour GTT and 28 weeks labs. Also needs provider visit in 2 weeks. .  Future Appointments  Date Time Provider Department Center  05/05/2020  8:20 AM [facility]  05/05/2020  9:15 AM Hermina Staggers, MD [facility]    Thressa Sheller DNP, CNM  04/20/20  4:13 PM

## 2020-05-04 ENCOUNTER — Other Ambulatory Visit: Payer: Self-pay

## 2020-05-04 DIAGNOSIS — Z3490 Encounter for supervision of normal pregnancy, unspecified, unspecified trimester: Secondary | ICD-10-CM

## 2020-05-05 ENCOUNTER — Other Ambulatory Visit: Payer: Self-pay

## 2020-05-05 ENCOUNTER — Ambulatory Visit (INDEPENDENT_AMBULATORY_CARE_PROVIDER_SITE_OTHER): Payer: Self-pay | Admitting: Obstetrics and Gynecology

## 2020-05-05 ENCOUNTER — Encounter: Payer: Self-pay | Admitting: Obstetrics and Gynecology

## 2020-05-05 VITALS — BP 118/66 | HR 105 | Wt 160.4 lb

## 2020-05-05 DIAGNOSIS — Z3493 Encounter for supervision of normal pregnancy, unspecified, third trimester: Secondary | ICD-10-CM

## 2020-05-05 NOTE — Progress Notes (Signed)
Subjective:  Kimberly Fox is a 24 y.o. G2P1001 at [redacted]w[redacted]d being seen today for ongoing prenatal care.  She is currently monitored for the following issues for this low-risk pregnancy and has History of laparoscopic cholecystectomy; Chronic calculous cholecystitis; and Supervision of low-risk pregnancy on their problem list.  Patient reports general discomforts of pregnancy.  Contractions: Irritability. Vag. Bleeding: None.  Movement: Present. Denies leaking of fluid.   The following portions of the patient's history were reviewed and updated as appropriate: allergies, current medications, past family history, past medical history, past social history, past surgical history and problem list. Problem list updated.  Objective:   Vitals:   05/05/20 0837  BP: 118/66  Pulse: (!) 105  Weight: 160 lb 6.4 oz (72.8 kg)    Fetal Status: Fetal Heart Rate (bpm): 150   Movement: Present     General:  Alert, oriented and cooperative. Patient is in no acute distress.  Skin: Skin is warm and dry. No rash noted.   Cardiovascular: Normal heart rate noted  Respiratory: Normal respiratory effort, no problems with respiration noted  Abdomen: Soft, gravid, appropriate for gestational age. Pain/Pressure: Present     Pelvic:  Cervical exam deferred        Extremities: Normal range of motion.  Edema: None  Mental Status: Normal mood and affect. Normal behavior. Normal judgment and thought content.   Urinalysis:      Assessment and Plan:  Pregnancy: G2P1001 at [redacted]w[redacted]d  1. Encounter for supervision of low-risk pregnancy in third trimester Stable 28 week labs today Declines Covid vaccine  Preterm labor symptoms and general obstetric precautions including but not limited to vaginal bleeding, contractions, leaking of fluid and fetal movement were reviewed in detail with the patient. Please refer to After Visit Summary for other counseling recommendations.  Return in about 2 weeks (around 05/19/2020)  for OB visit, face to face, any provider.   Hermina Staggers, MD

## 2020-05-05 NOTE — Patient Instructions (Signed)
Third Trimester of Pregnancy The third trimester is from week 28 through week 40 (months 7 through 9). The third trimester is a time when the unborn baby (fetus) is growing rapidly. At the end of the ninth month, the fetus is about 20 inches in length and weighs 6-10 pounds. Body changes during your third trimester Your body will continue to go through many changes during pregnancy. The changes vary from woman to woman. During the third trimester:  Your weight will continue to increase. You can expect to gain 25-35 pounds (11-16 kg) by the end of the pregnancy.  You may begin to get stretch marks on your hips, abdomen, and breasts.  You may urinate more often because the fetus is moving lower into your pelvis and pressing on your bladder.  You may develop or continue to have heartburn. This is caused by increased hormones that slow down muscles in the digestive tract.  You may develop or continue to have constipation because increased hormones slow digestion and cause the muscles that push waste through your intestines to relax.  You may develop hemorrhoids. These are swollen veins (varicose veins) in the rectum that can itch or be painful.  You may develop swollen, bulging veins (varicose veins) in your legs.  You may have increased body aches in the pelvis, back, or thighs. This is due to weight gain and increased hormones that are relaxing your joints.  You may have changes in your hair. These can include thickening of your hair, rapid growth, and changes in texture. Some women also have hair loss during or after pregnancy, or hair that feels dry or thin. Your hair will most likely return to normal after your baby is born.  Your breasts will continue to grow and they will continue to become tender. A yellow fluid (colostrum) may leak from your breasts. This is the first milk you are producing for your baby.  Your belly button may stick out.  You may notice more swelling in your hands,  face, or ankles.  You may have increased tingling or numbness in your hands, arms, and legs. The skin on your belly may also feel numb.  You may feel short of breath because of your expanding uterus.  You may have more problems sleeping. This can be caused by the size of your belly, increased need to urinate, and an increase in your body's metabolism.  You may notice the fetus "dropping," or moving lower in your abdomen (lightening).  You may have increased vaginal discharge.  You may notice your joints feel loose and you may have pain around your pelvic bone. What to expect at prenatal visits You will have prenatal exams every 2 weeks until week 36. Then you will have weekly prenatal exams. During a routine prenatal visit:  You will be weighed to make sure you and the baby are growing normally.  Your blood pressure will be taken.  Your abdomen will be measured to track your baby's growth.  The fetal heartbeat will be listened to.  Any test results from the previous visit will be discussed.  You may have a cervical check near your due date to see if your cervix has softened or thinned (effaced).  You will be tested for Group B streptococcus. This happens between 35 and 37 weeks. Your health care provider may ask you:  What your birth plan is.  How you are feeling.  If you are feeling the baby move.  If you have had any abnormal   symptoms, such as leaking fluid, bleeding, severe headaches, or abdominal cramping.  If you are using any tobacco products, including cigarettes, chewing tobacco, and electronic cigarettes.  If you have any questions. Other tests or screenings that may be performed during your third trimester include:  Blood tests that check for low iron levels (anemia).  Fetal testing to check the health, activity level, and growth of the fetus. Testing is done if you have certain medical conditions or if there are problems during the pregnancy.  Nonstress test  (NST). This test checks the health of your baby to make sure there are no signs of problems, such as the baby not getting enough oxygen. During this test, a belt is placed around your belly. The baby is made to move, and its heart rate is monitored during movement. What is false labor? False labor is a condition in which you feel small, irregular tightenings of the muscles in the womb (contractions) that usually go away with rest, changing position, or drinking water. These are called Braxton Hicks contractions. Contractions may last for hours, days, or even weeks before true labor sets in. If contractions come at regular intervals, become more frequent, increase in intensity, or become painful, you should see your health care provider. What are the signs of labor?  Abdominal cramps.  Regular contractions that start at 10 minutes apart and become stronger and more frequent with time.  Contractions that start on the top of the uterus and spread down to the lower abdomen and back.  Increased pelvic pressure and dull back pain.  A watery or bloody mucus discharge that comes from the vagina.  Leaking of amniotic fluid. This is also known as your "water breaking." It could be a slow trickle or a gush. Let your health care provider know if it has a color or strange odor. If you have any of these signs, call your health care provider right away, even if it is before your due date. Follow these instructions at home: Medicines  Follow your health care provider's instructions regarding medicine use. Specific medicines may be either safe or unsafe to take during pregnancy.  Take a prenatal vitamin that contains at least 600 micrograms (mcg) of folic acid.  If you develop constipation, try taking a stool softener if your health care provider approves. Eating and drinking   Eat a balanced diet that includes fresh fruits and vegetables, whole grains, good sources of protein such as meat, eggs, or tofu,  and low-fat dairy. Your health care provider will help you determine the amount of weight gain that is right for you.  Avoid raw meat and uncooked cheese. These carry germs that can cause birth defects in the baby.  If you have low calcium intake from food, talk to your health care provider about whether you should take a daily calcium supplement.  Eat four or five small meals rather than three large meals a day.  Limit foods that are high in fat and processed sugars, such as fried and sweet foods.  To prevent constipation: ? Drink enough fluid to keep your urine clear or pale yellow. ? Eat foods that are high in fiber, such as fresh fruits and vegetables, whole grains, and beans. Activity  Exercise only as directed by your health care provider. Most women can continue their usual exercise routine during pregnancy. Try to exercise for 30 minutes at least 5 days a week. Stop exercising if you experience uterine contractions.  Avoid heavy lifting.  Do   not exercise in extreme heat or humidity, or at high altitudes.  Wear low-heel, comfortable shoes.  Practice good posture.  You may continue to have sex unless your health care provider tells you otherwise. Relieving pain and discomfort  Take frequent breaks and rest with your legs elevated if you have leg cramps or low back pain.  Take warm sitz baths to soothe any pain or discomfort caused by hemorrhoids. Use hemorrhoid cream if your health care provider approves.  Wear a good support bra to prevent discomfort from breast tenderness.  If you develop varicose veins: ? Wear support pantyhose or compression stockings as told by your healthcare provider. ? Elevate your feet for 15 minutes, 3-4 times a day. Prenatal care  Write down your questions. Take them to your prenatal visits.  Keep all your prenatal visits as told by your health care provider. This is important. Safety  Wear your seat belt at all times when driving.  Make  a list of emergency phone numbers, including numbers for family, friends, the hospital, and police and fire departments. General instructions  Avoid cat litter boxes and soil used by cats. These carry germs that can cause birth defects in the baby. If you have a cat, ask someone to clean the litter box for you.  Do not travel far distances unless it is absolutely necessary and only with the approval of your health care provider.  Do not use hot tubs, steam rooms, or saunas.  Do not drink alcohol.  Do not use any products that contain nicotine or tobacco, such as cigarettes and e-cigarettes. If you need help quitting, ask your health care provider.  Do not use any medicinal herbs or unprescribed drugs. These chemicals affect the formation and growth of the baby.  Do not douche or use tampons or scented sanitary pads.  Do not cross your legs for long periods of time.  To prepare for the arrival of your baby: ? Take prenatal classes to understand, practice, and ask questions about labor and delivery. ? Make a trial run to the hospital. ? Visit the hospital and tour the maternity area. ? Arrange for maternity or paternity leave through employers. ? Arrange for family and friends to take care of pets while you are in the hospital. ? Purchase a rear-facing car seat and make sure you know how to install it in your car. ? Pack your hospital bag. ? Prepare the baby's nursery. Make sure to remove all pillows and stuffed animals from the baby's crib to prevent suffocation.  Visit your dentist if you have not gone during your pregnancy. Use a soft toothbrush to brush your teeth and be gentle when you floss. Contact a health care provider if:  You are unsure if you are in labor or if your water has broken.  You become dizzy.  You have mild pelvic cramps, pelvic pressure, or nagging pain in your abdominal area.  You have lower back pain.  You have persistent nausea, vomiting, or  diarrhea.  You have an unusual or bad smelling vaginal discharge.  You have pain when you urinate. Get help right away if:  Your water breaks before 37 weeks.  You have regular contractions less than 5 minutes apart before 37 weeks.  You have a fever.  You are leaking fluid from your vagina.  You have spotting or bleeding from your vagina.  You have severe abdominal pain or cramping.  You have rapid weight loss or weight gain.  You have   shortness of breath with chest pain.  You notice sudden or extreme swelling of your face, hands, ankles, feet, or legs.  Your baby makes fewer than 10 movements in 2 hours.  You have severe headaches that do not go away when you take medicine.  You have vision changes. Summary  The third trimester is from week 28 through week 40, months 7 through 9. The third trimester is a time when the unborn baby (fetus) is growing rapidly.  During the third trimester, your discomfort may increase as you and your baby continue to gain weight. You may have abdominal, leg, and back pain, sleeping problems, and an increased need to urinate.  During the third trimester your breasts will keep growing and they will continue to become tender. A yellow fluid (colostrum) may leak from your breasts. This is the first milk you are producing for your baby.  False labor is a condition in which you feel small, irregular tightenings of the muscles in the womb (contractions) that eventually go away. These are called Braxton Hicks contractions. Contractions may last for hours, days, or even weeks before true labor sets in.  Signs of labor can include: abdominal cramps; regular contractions that start at 10 minutes apart and become stronger and more frequent with time; watery or bloody mucus discharge that comes from the vagina; increased pelvic pressure and dull back pain; and leaking of amniotic fluid. This information is not intended to replace advice given to you by your  health care provider. Make sure you discuss any questions you have with your health care provider. Document Revised: 10/17/2018 Document Reviewed: 08/01/2016 Elsevier Patient Education  2020 [employer]  

## 2020-05-06 LAB — CBC
Hematocrit: 38.5 % (ref 34.0–46.6)
Hemoglobin: 12.6 g/dL (ref 11.1–15.9)
MCH: 27.5 pg (ref 26.6–33.0)
MCHC: 32.7 g/dL (ref 31.5–35.7)
MCV: 84 fL (ref 79–97)
Platelets: 210 10*3/uL (ref 150–450)
RBC: 4.58 x10E6/uL (ref 3.77–5.28)
RDW: 12.9 % (ref 11.7–15.4)
WBC: 9 10*3/uL (ref 3.4–10.8)

## 2020-05-06 LAB — GLUCOSE TOLERANCE, 2 HOURS W/ 1HR: Glucose, Fasting: 117 mg/dL — ABNORMAL HIGH (ref 65–91)

## 2020-05-06 LAB — HIV ANTIBODY (ROUTINE TESTING W REFLEX): HIV Screen 4th Generation wRfx: NONREACTIVE

## 2020-05-06 LAB — RPR: RPR Ser Ql: NONREACTIVE

## 2020-05-10 ENCOUNTER — Encounter: Payer: Self-pay | Admitting: Obstetrics and Gynecology

## 2020-05-10 ENCOUNTER — Other Ambulatory Visit: Payer: Self-pay

## 2020-05-10 DIAGNOSIS — O24419 Gestational diabetes mellitus in pregnancy, unspecified control: Secondary | ICD-10-CM | POA: Insufficient documentation

## 2020-05-10 DIAGNOSIS — Z3493 Encounter for supervision of normal pregnancy, unspecified, third trimester: Secondary | ICD-10-CM

## 2020-05-10 NOTE — Progress Notes (Unsigned)
Pt here today for second attempt of 2 HR GTT. Pt vomited again today per lab tech Crystal. Pt will be rescheduled to attempt GTT with jelly beans vs glucola.  Fleet Contras RN 05/10/20

## 2020-05-11 ENCOUNTER — Telehealth: Payer: Self-pay | Admitting: Lactation Services

## 2020-05-11 ENCOUNTER — Encounter: Payer: Self-pay | Admitting: Lactation Services

## 2020-05-11 NOTE — Telephone Encounter (Signed)
Called patient to inform her she has failed her glucose tolerance test and will need to follow up with Diabetes Education.   Patient did not answer and mailbox full so not able to leave a message. My Chart message sent.   Message to front desk to call and schedule patient for Diabetes Education.

## 2020-05-11 NOTE — Telephone Encounter (Signed)
Patient called and wanted to know more information on why she failed her GTT when she was not able to complete the test.   reviewed with her that her fasting level was elevated and that qualifies her as a GDM and we would like for her to follow up with Diabetes Education.   Patient voiced understanding.

## 2020-05-11 NOTE — Telephone Encounter (Signed)
-----   Message from Hermina Staggers, MD sent at 05/10/2020  5:02 PM EDT ----- Please let MS Campouzano that she failed her Glucola and has gestational Dm. Please refer her for DM education

## 2020-05-13 ENCOUNTER — Other Ambulatory Visit: Payer: Self-pay

## 2020-05-14 ENCOUNTER — Other Ambulatory Visit: Payer: Self-pay

## 2020-05-19 ENCOUNTER — Encounter: Payer: Self-pay | Admitting: Student

## 2020-05-20 ENCOUNTER — Ambulatory Visit: Payer: Self-pay | Admitting: Registered"

## 2020-05-20 ENCOUNTER — Encounter: Payer: Self-pay | Attending: Student | Admitting: Registered"

## 2020-05-20 ENCOUNTER — Ambulatory Visit (INDEPENDENT_AMBULATORY_CARE_PROVIDER_SITE_OTHER): Payer: Self-pay | Admitting: Certified Nurse Midwife

## 2020-05-20 ENCOUNTER — Other Ambulatory Visit: Payer: Self-pay

## 2020-05-20 VITALS — BP 108/63 | HR 89 | Wt 160.1 lb

## 2020-05-20 DIAGNOSIS — Z3A32 32 weeks gestation of pregnancy: Secondary | ICD-10-CM

## 2020-05-20 DIAGNOSIS — O219 Vomiting of pregnancy, unspecified: Secondary | ICD-10-CM

## 2020-05-20 DIAGNOSIS — O24410 Gestational diabetes mellitus in pregnancy, diet controlled: Secondary | ICD-10-CM

## 2020-05-20 DIAGNOSIS — O24419 Gestational diabetes mellitus in pregnancy, unspecified control: Secondary | ICD-10-CM | POA: Insufficient documentation

## 2020-05-20 DIAGNOSIS — Z3A00 Weeks of gestation of pregnancy not specified: Secondary | ICD-10-CM | POA: Insufficient documentation

## 2020-05-20 DIAGNOSIS — O0993 Supervision of high risk pregnancy, unspecified, third trimester: Secondary | ICD-10-CM

## 2020-05-20 NOTE — Progress Notes (Signed)
This patient is accompanied in the office by her significant other.  Patient was seen on 05/20/20 for Gestational Diabetes self-management. EDD 07/10/20. Patient states no history of GDM.   Patient states she has a fear of needles and it took her a few minutes before she could prick her finger. Pt states she was okay after she pricked her finger but as we were working her finger to get a blood sample she became light headed, c/o pain and became pale. Nurse evaluated, see RN notes for details.   Patient's husband states that he can check her blood sugar for her 2x per day and 4x/day on days off.  The following learning objectives were met by the patient :   States the definition of Gestational Diabetes  States why dietary management is important in controlling blood glucose  Describes the effects of carbohydrates on blood glucose levels  Demonstrates ability to create a balanced meal plan  Demonstrates carbohydrate counting   States when to check blood glucose levels  Demonstrates proper blood glucose monitoring techniques  States the effect of stress and exercise on blood glucose levels  States the importance of limiting caffeine and abstaining from alcohol and smoking  Plan:  Aim for 3 Carbohydrate Choices per meal (45 grams) +/- 1 either way  Aim for 1-2 Carbohydrate Choices per snack Begin reading food labels for Total Carbohydrate of foods If OK with your MD, consider  increasing your activity level by walking, Arm Chair Exercises or other activity daily as tolerated Begin checking Blood Glucose before breakfast and 2 hours after first bite of breakfast, lunch and dinner as directed by MD  Bring Log Book/Sheet and meter to every medical appointment  Baby Scripts: (BS 2.0 not capable of glucose management at this time.) Patient to record blood sugar on glucose log sheet  Take medication if directed by MD  Blood glucose monitor given: Prodigy CBG: 99 mg/dL  Patient  instructed to monitor glucose levels: FBS: 60 - 95 mg/dl 2 hour: <120 mg/dl  Patient received the following handouts:  Nutrition Diabetes and Pregnancy  Carbohydrate Counting List  Blood glucose Log Sheet  Patient will be seen for follow-up as needed. 

## 2020-05-20 NOTE — Progress Notes (Signed)
   PRENATAL VISIT NOTE  Subjective:  Kimberly Fox is a 24 y.o. G2P1001 at [redacted]w[redacted]d being seen today for ongoing prenatal care.  She is currently monitored for the following issues for this high-risk pregnancy and has History of laparoscopic cholecystectomy; Chronic calculous cholecystitis; Supervision of low-risk pregnancy; and DM (diabetes mellitus), gestational on their problem list.  Patient reports nausea and vomiting.  Contractions: Irritability. Vag. Bleeding: None.  Movement: Present. Denies leaking of fluid.   The following portions of the patient's history were reviewed and updated as appropriate: allergies, current medications, past family history, past medical history, past social history, past surgical history and problem list.   Objective:   Vitals:   05/20/20 1422  BP: 108/63  Pulse: 89  Weight: 160 lb 1.6 oz (72.6 kg)    Fetal Status: Fetal Heart Rate (bpm): 144 Fundal Height: 33 cm Movement: Present     General:  Alert, oriented and cooperative. Patient is in no acute distress.  Skin: Skin is warm and dry. No rash noted.   Cardiovascular: Normal heart rate noted  Respiratory: Normal respiratory effort, no problems with respiration noted  Abdomen: Soft, gravid, appropriate for gestational age.  Pain/Pressure: Present     Pelvic: Cervical exam deferred        Extremities: Normal range of motion.  Edema: None  Mental Status: Normal mood and affect. Normal behavior. Normal judgment and thought content.   Assessment and Plan:  Pregnancy: G2P1001 at [redacted]w[redacted]d 1. Supervision of high risk pregnancy in third trimester - Pt doing well, still having a lot of nausea and has a hard time keeping all meals down.  2. [redacted] weeks gestation of pregnancy - Weight has remained the same since 28wks  3. Diet controlled gestational diabetes mellitus (GDM) in third trimester -Patient unable to prick her own finger due to syncopy episodes when seeing blood and needles. Spouse will  prick finger twice a day during the week and 4 times a day during the weekend due to work schedule.  4. Nausea and vomiting in pregnancy - Educated patient on the use of ginger vitamins taken with food and Seaband for nausea. If nausea/vomiting continue further medication will be discussed at next visit.  Preterm labor symptoms and general obstetric precautions including but not limited to vaginal bleeding, contractions, leaking of fluid and fetal movement were reviewed in detail with the patient. Please refer to After Visit Summary for other counseling recommendations.   Return in about 2 weeks (around 06/03/2020).  Future Appointments  Date Time Provider Department Center  06/02/2020  2:35 PM Venora Maples, MD [facility]  06/16/2020  2:15 PM Venora Maples, MD [facility]  06/23/2020  2:15 PM Venora Maples, MD [facility]  06/30/2020  2:15 PM Venora Maples, MD [facility]  07/06/2020  2:15 PM Venora Maples, MD [facility]    Acquanetta Belling, Student-Nurse Midwife

## 2020-05-20 NOTE — Patient Instructions (Signed)
Morning Sickness  Morning sickness is when you feel sick to your stomach (nauseous) during pregnancy. You may feel sick to your stomach and throw up (vomit). You may feel sick in the morning, but you can feel this way at any time of day. Some women feel very sick to their stomach and cannot stop throwing up (hyperemesis gravidarum). Follow these instructions at home: Medicines  Take over-the-counter and prescription medicines only as told by your doctor. Do not take any medicines until you talk with your doctor about them first.  Taking multivitamins before getting pregnant can stop or lessen the harshness of morning sickness. Eating and drinking  Eat dry toast or crackers before getting out of bed.  Eat 5 or 6 small meals a day.  Eat dry and bland foods like rice and baked potatoes.  Do not eat greasy, fatty, or spicy foods.  Have someone cook for you if the smell of food causes you to feel sick or throw up.  If you feel sick to your stomach after taking prenatal vitamins, take them at night or with a snack.  Eat protein when you need a snack. Nuts, yogurt, and cheese are good choices.  Drink fluids throughout the day.  Try ginger ale made with real ginger, ginger tea made from fresh grated ginger, or ginger candies. General instructions  Do not use any products that have nicotine or tobacco in them, such as cigarettes and e-cigarettes. If you need help quitting, ask your doctor.  Use an air purifier to keep the air in your house free of smells.  Get lots of fresh air.  Try to avoid smells that make you feel sick.  Try: ? Wearing a bracelet that is used for seasickness (acupressure wristband). ? Going to a doctor who puts thin needles into certain body points (acupuncture) to improve how you feel. Contact a doctor if:  You need medicine to feel better.  You feel dizzy or light-headed.  You are losing weight. Get help right away if:  You feel very sick to your  stomach and cannot stop throwing up.  You pass out (faint).  You have very bad pain in your belly. Summary  Morning sickness is when you feel sick to your stomach (nauseous) during pregnancy.  You may feel sick in the morning, but you can feel this way at any time of day.  Making some changes to what you eat may help your symptoms go away. This information is not intended to replace advice given to you by your health care provider. Make sure you discuss any questions you have with your health care provider. Document Revised: 06/08/2017 Document Reviewed: 07/27/2016 Elsevier Patient Education  2020 [employer].   Gestational Diabetes Mellitus, Diagnosis Gestational diabetes (gestational diabetes mellitus) is a short-term (temporary) form of diabetes that can happen during pregnancy. It goes away after you give birth. It may be caused by one or both of these problems:  Your pancreas does not make enough of a hormone called insulin.  Your body does not respond in a normal way to insulin that it makes. Insulin lets sugars (glucose) go into cells in the body. This gives you energy. If you have diabetes, sugars cannot get into cells. This causes high blood sugar (hyperglycemia). If you get gestational diabetes, you are:  More likely to get it if you get pregnant again.  More likely to develop type 2 diabetes in the future. If gestational diabetes is treated, it may not hurt you  or your baby. Your doctor will set treatment goals for you. In general, you should have these blood sugar levels:  After not eating for a long time (fasting): 95 mg/dL (5.3 mmol/L).  After meals (postprandial): ? One hour after a meal: at or below 140 mg/dL (7.8 mmol/L). ? Two hours after a meal: at or below 120 mg/dL (6.7 mmol/L).  A1c (hemoglobin A1c) level: 6-6.5%. Follow these instructions at home: Questions to ask your doctor   You may want to ask these questions: ? Do I need to meet with a diabetes  educator? ? What equipment will I need to care for myself at home? ? What medicines do I need? When should I take them? ? How often do I need to check my blood sugar? ? What number can I call if I have questions? ? When is my next doctor's visit? General instructions  Take over-the-counter and prescription medicines only as told by your doctor.  Stay at a healthy weight during pregnancy.  Keep all follow-up visits as told by your doctor. This is important. Contact a doctor if:  Your blood sugar is at or above 240 mg/dL (16.3 mmol/L).  Your blood sugar is at or above 200 mg/dL (84.5 mmol/L) and you have ketones in your pee (urine).  You have been sick or have had a fever for 2 days or more and you are not getting better.  You have any of these problems for more than 6 hours: ? You cannot eat or drink. ? You feel sick to your stomach (nauseous). ? You throw up (vomit). ? You have watery poop (diarrhea). Get help right away if:  Your blood sugar is lower than 54 mg/dL (3 mmol/L).  You get confused.  You have trouble: ? Thinking clearly. ? Breathing.  Your baby moves less than normal.  You have any of these: ? Moderate or large ketone levels in your pee. ? Blood coming from your vagina. ? Unusual fluid coming from your vagina. ? Early contractions. These may feel like tightness in your belly. Summary  Gestational diabetes is a short-term form of diabetes. It can happen while you are pregnant. It goes away after you give birth.  If gestational diabetes is treated, it may not hurt you or your baby. Your doctor will set treatment goals for you.  Keep all follow-up visits as told by your doctor. This is important. This information is not intended to replace advice given to you by your health care provider. Make sure you discuss any questions you have with your health care provider. Document Revised: 08/02/2017 Document Reviewed: 07/30/2015 Elsevier Patient Education  2020  [employer].

## 2020-05-20 NOTE — Progress Notes (Signed)
Was called to Diabetes Education room as patient felt like she was going to pass out after checking her blood sugar. She reports her she is scared of needles and seeing blood.   When entered the room, patient had her head back over the back of the chair with eyes open and would not respond to questions. She was moaning and appeared to be gagging. Put her head up and supported it, placed cool cloth to head and fanned patient. Patient was pale in color. She did not vomit but indicated that she had pain in her epigastric area. After a few minutes patient did respond and was talking normally and reported she was ok. Gave her a glass of ice water to sip on. Baby was moving well during episode. She last ate at 10 am and blood glucose was 99. Patient left in care of Heywood Bene, RD and her significant other. Reported to Edd Arbour, CNM.

## 2020-05-22 ENCOUNTER — Encounter: Payer: Self-pay | Admitting: Certified Nurse Midwife

## 2020-05-24 ENCOUNTER — Other Ambulatory Visit: Payer: Self-pay | Admitting: *Deleted

## 2020-05-24 ENCOUNTER — Other Ambulatory Visit: Payer: Self-pay

## 2020-05-24 DIAGNOSIS — O24419 Gestational diabetes mellitus in pregnancy, unspecified control: Secondary | ICD-10-CM

## 2020-06-02 ENCOUNTER — Other Ambulatory Visit: Payer: Self-pay

## 2020-06-02 ENCOUNTER — Encounter: Payer: Self-pay | Admitting: Family Medicine

## 2020-06-02 ENCOUNTER — Ambulatory Visit (INDEPENDENT_AMBULATORY_CARE_PROVIDER_SITE_OTHER): Payer: Self-pay | Admitting: Family Medicine

## 2020-06-02 VITALS — BP 119/67 | HR 112 | Wt 160.2 lb

## 2020-06-02 DIAGNOSIS — Z3493 Encounter for supervision of normal pregnancy, unspecified, third trimester: Secondary | ICD-10-CM

## 2020-06-02 DIAGNOSIS — O24419 Gestational diabetes mellitus in pregnancy, unspecified control: Secondary | ICD-10-CM

## 2020-06-02 LAB — POCT URINALYSIS DIP (DEVICE)
Glucose, UA: 100 mg/dL — AB
Nitrite: NEGATIVE
Protein, ur: 30 mg/dL — AB
Specific Gravity, Urine: >=1.03 (ref 1.005–1.030)
Urobilinogen, UA: 0.2 mg/dL (ref 0.0–1.0)
pH: 6 (ref 5.0–8.0)

## 2020-06-02 NOTE — Patient Instructions (Signed)
 Third Trimester of Pregnancy The third trimester is from week 28 through week 40 (months 7 through 9). The third trimester is a time when the unborn baby (fetus) is growing rapidly. At the end of the ninth month, the fetus is about 20 inches in length and weighs 6-10 pounds. Body changes during your third trimester Your body will continue to go through many changes during pregnancy. The changes vary from woman to woman. During the third trimester:  Your weight will continue to increase. You can expect to gain 25-35 pounds (11-16 kg) by the end of the pregnancy.  You may begin to get stretch marks on your hips, abdomen, and breasts.  You may urinate more often because the fetus is moving lower into your pelvis and pressing on your bladder.  You may develop or continue to have heartburn. This is caused by increased hormones that slow down muscles in the digestive tract.  You may develop or continue to have constipation because increased hormones slow digestion and cause the muscles that push waste through your intestines to relax.  You may develop hemorrhoids. These are swollen veins (varicose veins) in the rectum that can itch or be painful.  You may develop swollen, bulging veins (varicose veins) in your legs.  You may have increased body aches in the pelvis, back, or thighs. This is due to weight gain and increased hormones that are relaxing your joints.  You may have changes in your hair. These can include thickening of your hair, rapid growth, and changes in texture. Some women also have hair loss during or after pregnancy, or hair that feels dry or thin. Your hair will most likely return to normal after your baby is born.  Your breasts will continue to grow and they will continue to become tender. A yellow fluid (colostrum) may leak from your breasts. This is the first milk you are producing for your baby.  Your belly button may stick out.  You may notice more swelling in your  hands, face, or ankles.  You may have increased tingling or numbness in your hands, arms, and legs. The skin on your belly may also feel numb.  You may feel short of breath because of your expanding uterus.  You may have more problems sleeping. This can be caused by the size of your belly, increased need to urinate, and an increase in your body's metabolism.  You may notice the fetus "dropping," or moving lower in your abdomen (lightening).  You may have increased vaginal discharge.  You may notice your joints feel loose and you may have pain around your pelvic bone. What to expect at prenatal visits You will have prenatal exams every 2 weeks until week 36. Then you will have weekly prenatal exams. During a routine prenatal visit:  You will be weighed to make sure you and the baby are growing normally.  Your blood pressure will be taken.  Your abdomen will be measured to track your baby's growth.  The fetal heartbeat will be listened to.  Any test results from the previous visit will be discussed.  You may have a cervical check near your due date to see if your cervix has softened or thinned (effaced).  You will be tested for Group B streptococcus. This happens between 35 and 37 weeks. Your health care provider may ask you:  What your birth plan is.  How you are feeling.  If you are feeling the baby move.  If you have had any   abnormal symptoms, such as leaking fluid, bleeding, severe headaches, or abdominal cramping.  If you are using any tobacco products, including cigarettes, chewing tobacco, and electronic cigarettes.  If you have any questions. Other tests or screenings that may be performed during your third trimester include:  Blood tests that check for low iron levels (anemia).  Fetal testing to check the health, activity level, and growth of the fetus. Testing is done if you have certain medical conditions or if there are problems during the  pregnancy.  Nonstress test (NST). This test checks the health of your baby to make sure there are no signs of problems, such as the baby not getting enough oxygen. During this test, a belt is placed around your belly. The baby is made to move, and its heart rate is monitored during movement. What is false labor? False labor is a condition in which you feel small, irregular tightenings of the muscles in the womb (contractions) that usually go away with rest, changing position, or drinking water. These are called Braxton Hicks contractions. Contractions may last for hours, days, or even weeks before true labor sets in. If contractions come at regular intervals, become more frequent, increase in intensity, or become painful, you should see your health care provider. What are the signs of labor?  Abdominal cramps.  Regular contractions that start at 10 minutes apart and become stronger and more frequent with time.  Contractions that start on the top of the uterus and spread down to the lower abdomen and back.  Increased pelvic pressure and dull back pain.  A watery or bloody mucus discharge that comes from the vagina.  Leaking of amniotic fluid. This is also known as your "water breaking." It could be a slow trickle or a gush. Let your health care provider know if it has a color or strange odor. If you have any of these signs, call your health care provider right away, even if it is before your due date. Follow these instructions at home: Medicines  Follow your health care provider's instructions regarding medicine use. Specific medicines may be either safe or unsafe to take during pregnancy.  Take a prenatal vitamin that contains at least 600 micrograms (mcg) of folic acid.  If you develop constipation, try taking a stool softener if your health care provider approves. Eating and drinking   Eat a balanced diet that includes fresh fruits and vegetables, whole grains, good sources of protein  such as meat, eggs, or tofu, and low-fat dairy. Your health care provider will help you determine the amount of weight gain that is right for you.  Avoid raw meat and uncooked cheese. These carry germs that can cause birth defects in the baby.  If you have low calcium intake from food, talk to your health care provider about whether you should take a daily calcium supplement.  Eat four or five small meals rather than three large meals a day.  Limit foods that are high in fat and processed sugars, such as fried and sweet foods.  To prevent constipation: ? Drink enough fluid to keep your urine clear or pale yellow. ? Eat foods that are high in fiber, such as fresh fruits and vegetables, whole grains, and beans. Activity  Exercise only as directed by your health care provider. Most women can continue their usual exercise routine during pregnancy. Try to exercise for 30 minutes at least 5 days a week. Stop exercising if you experience uterine contractions.  Avoid heavy lifting.    Do not exercise in extreme heat or humidity, or at high altitudes.  Wear low-heel, comfortable shoes.  Practice good posture.  You may continue to have sex unless your health care provider tells you otherwise. Relieving pain and discomfort  Take frequent breaks and rest with your legs elevated if you have leg cramps or low back pain.  Take warm sitz baths to soothe any pain or discomfort caused by hemorrhoids. Use hemorrhoid cream if your health care provider approves.  Wear a good support bra to prevent discomfort from breast tenderness.  If you develop varicose veins: ? Wear support pantyhose or compression stockings as told by your healthcare provider. ? Elevate your feet for 15 minutes, 3-4 times a day. Prenatal care  Write down your questions. Take them to your prenatal visits.  Keep all your prenatal visits as told by your health care provider. This is important. Safety  Wear your seat belt at  all times when driving.  Make a list of emergency phone numbers, including numbers for family, friends, the hospital, and police and fire departments. General instructions  Avoid cat litter boxes and soil used by cats. These carry germs that can cause birth defects in the baby. If you have a cat, ask someone to clean the litter box for you.  Do not travel far distances unless it is absolutely necessary and only with the approval of your health care provider.  Do not use hot tubs, steam rooms, or saunas.  Do not drink alcohol.  Do not use any products that contain nicotine or tobacco, such as cigarettes and e-cigarettes. If you need help quitting, ask your health care provider.  Do not use any medicinal herbs or unprescribed drugs. These chemicals affect the formation and growth of the baby.  Do not douche or use tampons or scented sanitary pads.  Do not cross your legs for long periods of time.  To prepare for the arrival of your baby: ? Take prenatal classes to understand, practice, and ask questions about labor and delivery. ? Make a trial run to the hospital. ? Visit the hospital and tour the maternity area. ? Arrange for maternity or paternity leave through employers. ? Arrange for family and friends to take care of pets while you are in the hospital. ? Purchase a rear-facing car seat and make sure you know how to install it in your car. ? Pack your hospital bag. ? Prepare the baby's nursery. Make sure to remove all pillows and stuffed animals from the baby's crib to prevent suffocation.  Visit your dentist if you have not gone during your pregnancy. Use a soft toothbrush to brush your teeth and be gentle when you floss. Contact a health care provider if:  You are unsure if you are in labor or if your water has broken.  You become dizzy.  You have mild pelvic cramps, pelvic pressure, or nagging pain in your abdominal area.  You have lower back pain.  You have persistent  nausea, vomiting, or diarrhea.  You have an unusual or bad smelling vaginal discharge.  You have pain when you urinate. Get help right away if:  Your water breaks before 37 weeks.  You have regular contractions less than 5 minutes apart before 37 weeks.  You have a fever.  You are leaking fluid from your vagina.  You have spotting or bleeding from your vagina.  You have severe abdominal pain or cramping.  You have rapid weight loss or weight gain.  You   have shortness of breath with chest pain.  You notice sudden or extreme swelling of your face, hands, ankles, feet, or legs.  Your baby makes fewer than 10 movements in 2 hours.  You have severe headaches that do not go away when you take medicine.  You have vision changes. Summary  The third trimester is from week 28 through week 40, months 7 through 9. The third trimester is a time when the unborn baby (fetus) is growing rapidly.  During the third trimester, your discomfort may increase as you and your baby continue to gain weight. You may have abdominal, leg, and back pain, sleeping problems, and an increased need to urinate.  During the third trimester your breasts will keep growing and they will continue to become tender. A yellow fluid (colostrum) may leak from your breasts. This is the first milk you are producing for your baby.  False labor is a condition in which you feel small, irregular tightenings of the muscles in the womb (contractions) that eventually go away. These are called Braxton Hicks contractions. Contractions may last for hours, days, or even weeks before true labor sets in.  Signs of labor can include: abdominal cramps; regular contractions that start at 10 minutes apart and become stronger and more frequent with time; watery or bloody mucus discharge that comes from the vagina; increased pelvic pressure and dull back pain; and leaking of amniotic fluid. This information is not intended to replace advice  given to you by your health care provider. Make sure you discuss any questions you have with your health care provider. Document Revised: 10/17/2018 Document Reviewed: 08/01/2016 Else[employer] Patient Education  2020 [employer]   Contraception Choices Contraception, also called birth control, refers to methods or devices that prevent pregnancy. Hormonal methods Contraceptive implant  A contraceptive implant is a thin, plastic tube that contains a hormone. It is inserted into the upper part of the arm. It can remain in place for up to 3 years. Progestin-only injections Progestin-only injections are injections of progestin, a synthetic form of the hormone progesterone. They are given every 3 months by a health care provider. Birth control pills  Birth control pills are pills that contain hormones that prevent pregnancy. They must be taken once a day, preferably at the same time each day. Birth control patch  The birth control patch contains hormones that prevent pregnancy. It is placed on the skin and must be changed once a week for three weeks and removed on the fourth week. A prescription is needed to use this method of contraception. Vaginal ring  A vaginal ring contains hormones that prevent pregnancy. It is placed in the vagina for three weeks and removed on the fourth week. After that, the process is repeated with a new ring. A prescription is needed to use this method of contraception. Emergency contraceptive Emergency contraceptives prevent pregnancy after unprotected sex. They come in pill form and can be taken up to 5 days after sex. They work best the sooner they are taken after having sex. Most emergency contraceptives are available without a prescription. This method should not be used as your only form of birth control. Barrier methods Female condom  A female condom is a thin sheath that is worn over the penis during sex. Condoms keep sperm from going inside a woman's body. They can  be used with a spermicide to increase their effectiveness. They should be disposed after a single use. Female condom  A female condom is a soft,   loose-fitting sheath that is put into the vagina before sex. The condom keeps sperm from going inside a woman's body. They should be disposed after a single use. Diaphragm  A diaphragm is a soft, dome-shaped barrier. It is inserted into the vagina before sex, along with a spermicide. The diaphragm blocks sperm from entering the uterus, and the spermicide kills sperm. A diaphragm should be left in the vagina for 6-8 hours after sex and removed within 24 hours. A diaphragm is prescribed and fitted by a health care provider. A diaphragm should be replaced every 1-2 years, after giving birth, after gaining more than 15 lb (6.8 kg), and after pelvic surgery. Cervical cap  A cervical cap is a round, soft latex or plastic cup that fits over the cervix. It is inserted into the vagina before sex, along with spermicide. It blocks sperm from entering the uterus. The cap should be left in place for 6-8 hours after sex and removed within 48 hours. A cervical cap must be prescribed and fitted by a health care provider. It should be replaced every 2 years. Sponge  A sponge is a soft, circular piece of polyurethane foam with spermicide on it. The sponge helps block sperm from entering the uterus, and the spermicide kills sperm. To use it, you make it wet and then insert it into the vagina. It should be inserted before sex, left in for at least 6 hours after sex, and removed and thrown away within 30 hours. Spermicides Spermicides are chemicals that kill or block sperm from entering the cervix and uterus. They can come as a cream, jelly, suppository, foam, or tablet. A spermicide should be inserted into the vagina with an applicator at least 10-15 minutes before sex to allow time for it to work. The process must be repeated every time you have sex. Spermicides do not require  a prescription. Intrauterine contraception Intrauterine device (IUD) An IUD is a T-shaped device that is put in a woman's uterus. There are two types:  Hormone IUD.This type contains progestin, a synthetic form of the hormone progesterone. This type can stay in place for 3-5 years.  Copper IUD.This type is wrapped in copper wire. It can stay in place for 10 years.  Permanent methods of contraception Female tubal ligation In this method, a woman's fallopian tubes are sealed, tied, or blocked during surgery to prevent eggs from traveling to the uterus. Hysteroscopic sterilization In this method, a small, flexible insert is placed into each fallopian tube. The inserts cause scar tissue to form in the fallopian tubes and block them, so sperm cannot reach an egg. The procedure takes about 3 months to be effective. Another form of birth control must be used during those 3 months. Female sterilization This is a procedure to tie off the tubes that carry sperm (vasectomy). After the procedure, the man can still ejaculate fluid (semen). Natural planning methods Natural family planning In this method, a couple does not have sex on days when the woman could become pregnant. Calendar method This means keeping track of the length of each menstrual cycle, identifying the days when pregnancy can happen, and not having sex on those days. Ovulation method In this method, a couple avoids sex during ovulation. Symptothermal method This method involves not having sex during ovulation. The woman typically checks for ovulation by watching changes in her temperature and in the consistency of cervical mucus. Post-ovulation method In this method, a couple waits to have sex until after ovulation. Summary    Contraception, also called birth control, means methods or devices that prevent pregnancy.  Hormonal methods of contraception include implants, injections, pills, patches, vaginal rings, and emergency  contraceptives.  Barrier methods of contraception can include female condoms, female condoms, diaphragms, cervical caps, sponges, and spermicides.  There are two types of IUDs (intrauterine devices). An IUD can be put in a woman's uterus to prevent pregnancy for 3-5 years.  Permanent sterilization can be done through a procedure for males, females, or both.  Natural family planning methods involve not having sex on days when the woman could become pregnant. This information is not intended to replace advice given to you by your health care provider. Make sure you discuss any questions you have with your health care provider. Document Revised: 06/28/2017 Document Reviewed: 07/29/2016 [employer] Patient Education  2020 [employer]   Breastfeeding  Choosing to breastfeed is one of the best decisions you can make for yourself and your baby. A change in hormones during pregnancy causes your breasts to make breast milk in your milk-producing glands. Hormones prevent breast milk from being released before your baby is born. They also prompt milk flow after birth. Once breastfeeding has begun, thoughts of your baby, as well as his or her sucking or crying, can stimulate the release of milk from your milk-producing glands. Benefits of breastfeeding Research shows that breastfeeding offers many health benefits for infants and mothers. It also offers a cost-free and convenient way to feed your baby. For your baby  Your first milk (colostrum) helps your baby's digestive system to function better.  Special cells in your milk (antibodies) help your baby to fight off infections.  Breastfed babies are less likely to develop asthma, allergies, obesity, or type 2 diabetes. They are also at lower risk for sudden infant death syndrome (SIDS).  Nutrients in breast milk are better able to meet your baby's needs compared to infant formula.  Breast milk improves your baby's brain development. For  you  Breastfeeding helps to create a very special bond between you and your baby.  Breastfeeding is convenient. Breast milk costs nothing and is always available at the correct temperature.  Breastfeeding helps to burn calories. It helps you to lose the weight that you gained during pregnancy.  Breastfeeding makes your uterus return faster to its size before pregnancy. It also slows bleeding (lochia) after you give birth.  Breastfeeding helps to lower your risk of developing type 2 diabetes, osteoporosis, rheumatoid arthritis, cardiovascular disease, and breast, ovarian, uterine, and endometrial cancer later in life. Breastfeeding basics Starting breastfeeding  Find a comfortable place to sit or lie down, with your neck and back well-supported.  Place a pillow or a rolled-up blanket under your baby to bring him or her to the level of your breast (if you are seated). Nursing pillows are specially designed to help support your arms and your baby while you breastfeed.  Make sure that your baby's tummy (abdomen) is facing your abdomen.  Gently massage your breast. With your fingertips, massage from the outer edges of your breast inward toward the nipple. This encourages milk flow. If your milk flows slowly, you may need to continue this action during the feeding.  Support your breast with 4 fingers underneath and your thumb above your nipple (make the letter "C" with your hand). Make sure your fingers are well away from your nipple and your baby's mouth.  Stroke your baby's lips gently with your finger or nipple.  When your baby's mouth is open   wide enough, quickly bring your baby to your breast, placing your entire nipple and as much of the areola as possible into your baby's mouth. The areola is the colored area around your nipple. ? More areola should be visible above your baby's upper lip than below the lower lip. ? Your baby's lips should be opened and extended outward (flanged) to  ensure an adequate, comfortable latch. ? Your baby's tongue should be between his or her lower gum and your breast.  Make sure that your baby's mouth is correctly positioned around your nipple (latched). Your baby's lips should create a seal on your breast and be turned out (everted).  It is common for your baby to suck about 2-3 minutes in order to start the flow of breast milk. Latching Teaching your baby how to latch onto your breast properly is very important. An improper latch can cause nipple pain, decreased milk supply, and poor weight gain in your baby. Also, if your baby is not latched onto your nipple properly, he or she may swallow some air during feeding. This can make your baby fussy. Burping your baby when you switch breasts during the feeding can help to get rid of the air. However, teaching your baby to latch on properly is still the best way to prevent fussiness from swallowing air while breastfeeding. Signs that your baby has successfully latched onto your nipple  Silent tugging or silent sucking, without causing you pain. Infant's lips should be extended outward (flanged).  Swallowing heard between every 3-4 sucks once your milk has started to flow (after your let-down milk reflex occurs).  Muscle movement above and in front of his or her ears while sucking. Signs that your baby has not successfully latched onto your nipple  Sucking sounds or smacking sounds from your baby while breastfeeding.  Nipple pain. If you think your baby has not latched on correctly, slip your finger into the corner of your baby's mouth to break the suction and place it between your baby's gums. Attempt to start breastfeeding again. Signs of successful breastfeeding Signs from your baby  Your baby will gradually decrease the number of sucks or will completely stop sucking.  Your baby will fall asleep.  Your baby's body will relax.  Your baby will retain a small amount of milk in his or her  mouth.  Your baby will let go of your breast by himself or herself. Signs from you  Breasts that have increased in firmness, weight, and size 1-3 hours after feeding.  Breasts that are softer immediately after breastfeeding.  Increased milk volume, as well as a change in milk consistency and color by the fifth day of breastfeeding.  Nipples that are not sore, cracked, or bleeding. Signs that your baby is getting enough milk  Wetting at least 1-2 diapers during the first 24 hours after birth.  Wetting at least 5-6 diapers every 24 hours for the first week after birth. The urine should be clear or pale yellow by the age of 5 days.  Wetting 6-8 diapers every 24 hours as your baby continues to grow and develop.  At least 3 stools in a 24-hour period by the age of 5 days. The stool should be soft and yellow.  At least 3 stools in a 24-hour period by the age of 7 days. The stool should be seedy and yellow.  No loss of weight greater than 10% of birth weight during the first 3 days of life.  Average weight gain   of 4-7 oz (113-198 g) per week after the age of 4 days.  Consistent daily weight gain by the age of 5 days, without weight loss after the age of 2 weeks. After a feeding, your baby may spit up a small amount of milk. This is normal. Breastfeeding frequency and duration Frequent feeding will help you make more milk and can prevent sore nipples and extremely full breasts (breast engorgement). Breastfeed when you feel the need to reduce the fullness of your breasts or when your baby shows signs of hunger. This is called "breastfeeding on demand." Signs that your baby is hungry include:  Increased alertness, activity, or restlessness.  Movement of the head from side to side.  Opening of the mouth when the corner of the mouth or cheek is stroked (rooting).  Increased sucking sounds, smacking lips, cooing, sighing, or squeaking.  Hand-to-mouth movements and sucking on fingers or  hands.  Fussing or crying. Avoid introducing a pacifier to your baby in the first 4-6 weeks after your baby is born. After this time, you may choose to use a pacifier. Research has shown that pacifier use during the first year of a baby's life decreases the risk of sudden infant death syndrome (SIDS). Allow your baby to feed on each breast as long as he or she wants. When your baby unlatches or falls asleep while feeding from the first breast, offer the second breast. Because newborns are often sleepy in the first few weeks of life, you may need to awaken your baby to get him or her to feed. Breastfeeding times will vary from baby to baby. However, the following rules can serve as a guide to help you make sure that your baby is properly fed:  Newborns (babies 4 weeks of age or younger) may breastfeed every 1-3 hours.  Newborns should not go without breastfeeding for longer than 3 hours during the day or 5 hours during the night.  You should breastfeed your baby a minimum of 8 times in a 24-hour period. Breast milk pumping     Pumping and storing breast milk allows you to make sure that your baby is exclusively fed your breast milk, even at times when you are unable to breastfeed. This is especially important if you go back to work while you are still breastfeeding, or if you are not able to be present during feedings. Your lactation consultant can help you find a method of pumping that works best for you and give you guidelines about how long it is safe to store breast milk. Caring for your breasts while you breastfeed Nipples can become dry, cracked, and sore while breastfeeding. The following recommendations can help keep your breasts moisturized and healthy:  Avoid using soap on your nipples.  Wear a supportive bra designed especially for nursing. Avoid wearing underwire-style bras or extremely tight bras (sports bras).  Air-dry your nipples for 3-4 minutes after each feeding.  Use only  cotton bra pads to absorb leaked breast milk. Leaking of breast milk between feedings is normal.  Use lanolin on your nipples after breastfeeding. Lanolin helps to maintain your skin's normal moisture barrier. Pure lanolin is not harmful (not toxic) to your baby. You may also hand express a few drops of breast milk and gently massage that milk into your nipples and allow the milk to air-dry. In the first few weeks after giving birth, some women experience breast engorgement. Engorgement can make your breasts feel heavy, warm, and tender to the touch. Engorgement   peaks within 3-5 days after you give birth. The following recommendations can help to ease engorgement:  Completely empty your breasts while breastfeeding or pumping. You may want to start by applying warm, moist heat (in the shower or with warm, water-soaked hand towels) just before feeding or pumping. This increases circulation and helps the milk flow. If your baby does not completely empty your breasts while breastfeeding, pump any extra milk after he or she is finished.  Apply ice packs to your breasts immediately after breastfeeding or pumping, unless this is too uncomfortable for you. To do this: ? Put ice in a plastic bag. ? Place a towel between your skin and the bag. ? Leave the ice on for 20 minutes, 2-3 times a day.  Make sure that your baby is latched on and positioned properly while breastfeeding. If engorgement persists after 48 hours of following these recommendations, contact your health care provider or a lactation consultant. Overall health care recommendations while breastfeeding  Eat 3 healthy meals and 3 snacks every day. Well-nourished mothers who are breastfeeding need an additional 450-500 calories a day. You can meet this requirement by increasing the amount of a balanced diet that you eat.  Drink enough water to keep your urine pale yellow or clear.  Rest often, relax, and continue to take your prenatal vitamins  to prevent fatigue, stress, and low vitamin and mineral levels in your body (nutrient deficiencies).  Do not use any products that contain nicotine or tobacco, such as cigarettes and e-cigarettes. Your baby may be harmed by chemicals from cigarettes that pass into breast milk and exposure to secondhand smoke. If you need help quitting, ask your health care provider.  Avoid alcohol.  Do not use illegal drugs or marijuana.  Talk with your health care provider before taking any medicines. These include over-the-counter and prescription medicines as well as vitamins and herbal supplements. Some medicines that may be harmful to your baby can pass through breast milk.  It is possible to become pregnant while breastfeeding. If birth control is desired, ask your health care provider about options that will be safe while breastfeeding your baby. Where to find more information: [employer]: www.llli.org Contact a health care provider if:  You feel like you want to stop breastfeeding or have become frustrated with breastfeeding.  Your nipples are cracked or bleeding.  Your breasts are red, tender, or warm.  You have: ? Painful breasts or nipples. ? A swollen area on either breast. ? A fever or chills. ? Nausea or vomiting. ? Drainage other than breast milk from your nipples.  Your breasts do not become full before feedings by the fifth day after you give birth.  You feel sad and depressed.  Your baby is: ? Too sleepy to eat well. ? Having trouble sleeping. ? More than 1 week old and wetting fewer than 6 diapers in a 24-hour period. ? Not gaining weight by 5 days of age.  Your baby has fewer than 3 stools in a 24-hour period.  Your baby's skin or the white parts of his or her eyes become yellow. Get help right away if:  Your baby is overly tired (lethargic) and does not want to wake up and feed.  Your baby develops an unexplained fever. Summary  Breastfeeding  offers many health benefits for infant and mothers.  Try to breastfeed your infant when he or she shows early signs of hunger.  Gently tickle or stroke your baby's lips with   your finger or nipple to allow the baby to open his or her mouth. Bring the baby to your breast. Make sure that much of the areola is in your baby's mouth. Offer one side and burp the baby before you offer the other side.  Talk with your health care provider or lactation consultant if you have questions or you face problems as you breastfeed. This information is not intended to replace advice given to you by your health care provider. Make sure you discuss any questions you have with your health care provider. Document Revised: 09/20/2017 Document Reviewed: 07/28/2016 Elsevier Patient Education  2020 [employer]  

## 2020-06-02 NOTE — Progress Notes (Signed)
   Subjective:  Kimberly Fox is a 24 y.o. G2P1001 at [redacted]w[redacted]d being seen today for ongoing prenatal care.  She is currently monitored for the following issues for this high-risk pregnancy and has History of laparoscopic cholecystectomy; Chronic calculous cholecystitis; Supervision of low-risk pregnancy; and DM (diabetes mellitus), gestational on their problem list.  Patient reports no complaints.  Contractions: Not present. Vag. Bleeding: None.  Movement: Present. Denies leaking of fluid.   The following portions of the patient's history were reviewed and updated as appropriate: allergies, current medications, past family history, past medical history, past social history, past surgical history and problem list. Problem list updated.  Objective:   Vitals:   06/02/20 1507  BP: 119/67  Pulse: (!) 112  Weight: 160 lb 3.2 oz (72.7 kg)    Fetal Status: Fetal Heart Rate (bpm): 134   Movement: Present     General:  Alert, oriented and cooperative. Patient is in no acute distress.  Skin: Skin is warm and dry. No rash noted.   Cardiovascular: Normal heart rate noted  Respiratory: Normal respiratory effort, no problems with respiration noted  Abdomen: Soft, gravid, appropriate for gestational age. Pain/Pressure: Present     Pelvic: Vag. Bleeding: None     Cervical exam deferred        Extremities: Normal range of motion.  Edema: None  Mental Status: Normal mood and affect. Normal behavior. Normal judgment and thought content.   Urinalysis:      Assessment and Plan:  Pregnancy: G2P1001 at [redacted]w[redacted]d  1. Encounter for supervision of low-risk pregnancy in third trimester BP and FHR normal Discussed contraception at length, interested in LARC and leaning towards copper IUD but will review AVS material and bedsiders, discuss more at next visit  2. Gestational diabetes mellitus (GDM), antepartum, gestational diabetes method of control unspecified Only checking sugars BID due to needle/blood  phobia with syncope during the week (morning and night when husband is able to help her) and QID on weekends 24/30 at goal (80%), overall doing well, seems to be mostly evening post-prandials that are elevated Discussed stacking effect of sugars throughout the day and adherence to diabetic diet Needs growth [place] and swabs at next visit  Preterm labor symptoms and general obstetric precautions including but not limited to vaginal bleeding, contractions, leaking of fluid and fetal movement were reviewed in detail with the patient. Please refer to After Visit Summary for other counseling recommendations.  Return in 2 weeks (on 06/16/2020) for [facility].   Venora Maples, MD

## 2020-06-16 ENCOUNTER — Encounter: Payer: Self-pay | Admitting: Family Medicine

## 2020-06-16 ENCOUNTER — Ambulatory Visit (HOSPITAL_BASED_OUTPATIENT_CLINIC_OR_DEPARTMENT_OTHER): Payer: Self-pay

## 2020-06-16 ENCOUNTER — Encounter: Payer: Self-pay | Admitting: *Deleted

## 2020-06-16 ENCOUNTER — Other Ambulatory Visit (HOSPITAL_COMMUNITY)
Admission: RE | Admit: 2020-06-16 | Discharge: 2020-06-16 | Disposition: A | Discharge status: Home / Self Care | Payer: Self-pay | Source: Ambulatory Visit | Attending: Family Medicine | Admitting: Family Medicine

## 2020-06-16 ENCOUNTER — Ambulatory Visit: Payer: Self-pay | Attending: Family Medicine | Admitting: *Deleted

## 2020-06-16 ENCOUNTER — Ambulatory Visit: Payer: Self-pay

## 2020-06-16 ENCOUNTER — Ambulatory Visit (INDEPENDENT_AMBULATORY_CARE_PROVIDER_SITE_OTHER): Payer: Self-pay | Admitting: Women's Health

## 2020-06-16 ENCOUNTER — Other Ambulatory Visit: Payer: Self-pay

## 2020-06-16 VITALS — BP 116/62 | HR 98

## 2020-06-16 VITALS — BP 106/64 | HR 93 | Wt 159.0 lb

## 2020-06-16 DIAGNOSIS — Z3A36 36 weeks gestation of pregnancy: Secondary | ICD-10-CM

## 2020-06-16 DIAGNOSIS — O24419 Gestational diabetes mellitus in pregnancy, unspecified control: Secondary | ICD-10-CM

## 2020-06-16 DIAGNOSIS — O24410 Gestational diabetes mellitus in pregnancy, diet controlled: Secondary | ICD-10-CM | POA: Insufficient documentation

## 2020-06-16 DIAGNOSIS — R002 Palpitations: Secondary | ICD-10-CM

## 2020-06-16 DIAGNOSIS — Z9049 Acquired absence of other specified parts of digestive tract: Secondary | ICD-10-CM

## 2020-06-16 DIAGNOSIS — K8010 Calculus of gallbladder with chronic cholecystitis without obstruction: Secondary | ICD-10-CM

## 2020-06-16 DIAGNOSIS — Z3493 Encounter for supervision of normal pregnancy, unspecified, third trimester: Secondary | ICD-10-CM

## 2020-06-16 DIAGNOSIS — Z362 Encounter for other antenatal screening follow-up: Secondary | ICD-10-CM

## 2020-06-16 MED ORDER — METFORMIN HCL 500 MG PO TABS: 500.0000 mg | ORAL_TABLET | Freq: Two times a day (BID) | ORAL | 1 refills | Status: DC

## 2020-06-16 NOTE — Patient Instructions (Addendum)
Maternity Assessment Unit (MAU)  The Maternity Assessment Unit (MAU) is located at the [facility] at [facility]. The address is: [address], [address], [place], [place] [address]. Please see map below for additional directions.    The Maternity Assessment Unit is designed to help you during your pregnancy, and for up to 6 weeks after delivery, with any pregnancy- or postpartum-related emergencies, if you think you are in labor, or if your water has broken. For example, if you experience nausea and vomiting, vaginal bleeding, severe abdominal or pelvic pain, elevated blood pressure or other problems related to your pregnancy or postpartum time, please come to the Maternity Assessment Unit for assistance.        Contraception Choices Contraception, also called birth control, refers to methods or devices that prevent pregnancy. Hormonal methods Contraceptive implant  A contraceptive implant is a thin, plastic tube that contains a hormone. It is inserted into the upper part of the arm. It can remain in place for up to 3 years. Progestin-only injections Progestin-only injections are injections of progestin, a synthetic form of the hormone progesterone. They are given every 3 months by a health care provider. Birth control pills  Birth control pills are pills that contain hormones that prevent pregnancy. They must be taken once a day, preferably at the same time each day. Birth control patch  The birth control patch contains hormones that prevent pregnancy. It is placed on the skin and must be changed once a week for three weeks and removed on the fourth week. A prescription is needed to use this method of contraception. Vaginal ring  A vaginal ring contains hormones that prevent pregnancy. It is placed in the vagina for three weeks and removed on the fourth week. After that, the process is repeated with a new ring. A prescription is needed to use this  method of contraception. Emergency contraceptive Emergency contraceptives prevent pregnancy after unprotected sex. They come in pill form and can be taken up to 5 days after sex. They work best the sooner they are taken after having sex. Most emergency contraceptives are available without a prescription. This method should not be used as your only form of birth control. Barrier methods Female condom  A female condom is a thin sheath that is worn over the penis during sex. Condoms keep sperm from going inside a woman's body. They can be used with a spermicide to increase their effectiveness. They should be disposed after a single use. Female condom  A female condom is a soft, loose-fitting sheath that is put into the vagina before sex. The condom keeps sperm from going inside a woman's body. They should be disposed after a single use. Diaphragm  A diaphragm is a soft, dome-shaped barrier. It is inserted into the vagina before sex, along with a spermicide. The diaphragm blocks sperm from entering the uterus, and the spermicide kills sperm. A diaphragm should be left in the vagina for 6-8 hours after sex and removed within 24 hours. A diaphragm is prescribed and fitted by a health care provider. A diaphragm should be replaced every 1-2 years, after giving birth, after gaining more than 15 lb (6.8 kg), and after pelvic surgery. Cervical cap  A cervical cap is a round, soft latex or plastic cup that fits over the cervix. It is inserted into the vagina before sex, along with spermicide. It blocks sperm from entering the uterus. The cap should be left in place for 6-8 hours   after sex and removed within 48 hours. A cervical cap must be prescribed and fitted by a health care provider. It should be replaced every 2 years. Sponge  A sponge is a soft, circular piece of polyurethane foam with spermicide on it. The sponge helps block sperm from entering the uterus, and the spermicide kills sperm. To use it, you  make it wet and then insert it into the vagina. It should be inserted before sex, left in for at least 6 hours after sex, and removed and thrown away within 30 hours. Spermicides Spermicides are chemicals that kill or block sperm from entering the cervix and uterus. They can come as a cream, jelly, suppository, foam, or tablet. A spermicide should be inserted into the vagina with an applicator at least 10-15 minutes before sex to allow time for it to work. The process must be repeated every time you have sex. Spermicides do not require a prescription. Intrauterine contraception Intrauterine device (IUD) An IUD is a T-shaped device that is put in a woman's uterus. There are two types:  Hormone IUD.This type contains progestin, a synthetic form of the hormone progesterone. This type can stay in place for 3-5 years.  Copper IUD.This type is wrapped in copper wire. It can stay in place for 10 years.  Permanent methods of contraception Female tubal ligation In this method, a woman's fallopian tubes are sealed, tied, or blocked during surgery to prevent eggs from traveling to the uterus. Hysteroscopic sterilization In this method, a small, flexible insert is placed into each fallopian tube. The inserts cause scar tissue to form in the fallopian tubes and block them, so sperm cannot reach an egg. The procedure takes about 3 months to be effective. Another form of birth control must be used during those 3 months. Female sterilization This is a procedure to tie off the tubes that carry sperm (vasectomy). After the procedure, the man can still ejaculate fluid (semen). Natural planning methods Natural family planning In this method, a couple does not have sex on days when the woman could become pregnant. Calendar method This means keeping track of the length of each menstrual cycle, identifying the days when pregnancy can happen, and not having sex on those days. Ovulation method In this method, a couple  avoids sex during ovulation. Symptothermal method This method involves not having sex during ovulation. The woman typically checks for ovulation by watching changes in her temperature and in the consistency of cervical mucus. Post-ovulation method In this method, a couple waits to have sex until after ovulation. Summary  Contraception, also called birth control, means methods or devices that prevent pregnancy.  Hormonal methods of contraception include implants, injections, pills, patches, vaginal rings, and emergency contraceptives.  Barrier methods of contraception can include female condoms, female condoms, diaphragms, cervical caps, sponges, and spermicides.  There are two types of IUDs (intrauterine devices). An IUD can be put in a woman's uterus to prevent pregnancy for 3-5 years.  Permanent sterilization can be done through a procedure for males, females, or both.  Natural family planning methods involve not having sex on days when the woman could become pregnant. This information is not intended to replace advice given to you by your health care provider. Make sure you discuss any questions you have with your health care provider. Document Revised: 06/28/2017 Document Reviewed: 07/29/2016 Elsevier Patient Education  2020 [employer].       Places to have your son circumcised (for self-pay patients//Medicaid now covers circumcisions):  [facility]     [phone]   $480 while you are in hospital         [facility]              [phone]   $269 by 4 wks                      [facility]                     [phone]   $269 by 7 days [facility]                    [phone]   $269 by 4 wks [facility]             [phone]   $225 by 2 wks    These prices sometimes change but are roughly what you can expect to pay. Please call and confirm pricing.   There are many reasons parents decide to have their sons circumsized. During  the first year of life circumcised males have a reduced risk of urinary tract infections but after this year the rates between circumcised males and uncircumcised males are the same.  It can reduce rate of HIV and other STI infection later in life.  It is safe to have your son circumcised outside of the hospital and the places above perform them regularly.   Deciding about Circumcision in Baby Boys  (Up-to-date The Basics)  What is circumcision?   Circumcision is a surgery that removes the skin that covers the tip of the penis, called the "foreskin" Circumcision is usually done when a boy is between 13 and 4 days old. In the [place], circumcision is common. In some other countries, fewer boys are circumcised. Circumcision is a common tradition in some religions.  Should I have my baby boy circumcised?   There is no easy answer. Circumcision has some benefits. But it also has risks. After talking with your doctor, you will have to decide for yourself what is right for your family.  What are the benefits of circumcision?   Circumcised boys seem to have slightly lower rates of: ?Urinary tract infections ?Swelling of the opening at the tip of the penis Circumcised men seem to have slightly lower rates of: ?Urinary tract infections ?Swelling of the opening at the tip of the penis ?Penis cancer ?HIV and other infections that you catch during sex, or transmit to a future partner(s) ?Cervical cancer in the women they have sex with Even so, in the [place], the risks of these problems are small - even in boys and men who have not been circumcised. Plus, boys and men who are not circumcised can reduce these extra risks by: ?Cleaning their penis well ?Using condoms during sex  What are the risks of circumcision?  Risks include: ?Bleeding or infection from the surgery ?Damage to or amputation of the penis ?A chance that the doctor will cut off too much or not enough of the  foreskin ?A chance that sex won't feel as good later in life Only about 1 out of every 200 circumcisions leads to problems. There is also a chance that your health insurance won't pay for circumcision.  How is circumcision done in baby boys?  First, the baby gets medicine for pain relief. This might be a cream on the skin or a shot into the base of the penis. Next, the doctor cleans the baby's penis well. Then he  or she uses special tools to cut off the foreskin. Finally, the doctor wraps a bandage (called gauze) around the baby's penis. If you have your baby circumcised, his doctor or nurse will give you instructions on how to care for him after the surgery. It is important that you follow those instructions carefully.       Deciding about Circumcision in Baby Boys  (The Basics)  What is circumcision?  Circumcision is a surgery that removes the skin that covers the tip of the penis, called the "foreskin" Circumcision is usually done when a boy is between 77 and 49 days old. In the [place], circumcision is common. In some other countries, fewer boys are circumcised. Circumcision is a common tradition in some religions.  Should I have my baby boy circumcised?  There is no easy answer. Circumcision has some benefits. But it also has risks. After talking with your doctor, you will have to decide for yourself what is right for your family.  What are the benefits of circumcision?  Circumcised boys seem to have slightly lower rates of: ?Urinary tract infections ?Swelling of the opening at the tip of the penis Circumcised men seem to have slightly lower rates of: ?Urinary tract infections ?Swelling of the opening at the tip of the penis ?Penis cancer ?HIV and other infections that you catch during sex ?Cervical cancer in the women they have sex with Even so, in the [place], the risks of these problems are small - even in boys and men who have not been circumcised. Plus,  boys and men who are not circumcised can reduce these extra risks by: ?Cleaning their penis well ?Using condoms during sex  What are the risks of circumcision?  Risks include: ?Bleeding or infection from the surgery ?Damage to or amputation of the penis ?A chance that the doctor will cut off too much or not enough of the foreskin ?A chance that sex won't feel as good later in life Only about 1 out of every 200 circumcisions leads to problems. There is also a chance that your health insurance won't pay for circumcision.  How is circumcision done in baby boys?  First, the baby gets medicine for pain relief. This might be a cream on the skin or a shot into the base of the penis. Next, the doctor cleans the baby's penis well. Then he or she uses special tools to cut off the foreskin. Finally, the doctor wraps a bandage (called gauze) around the baby's penis. If you have your baby circumcised, his doctor or nurse will give you instructions on how to care for him after the surgery. It is important that you follow those instructions carefully.         Signs and Symptoms of Labor Labor is your body's natural process of moving your baby, placenta, and umbilical cord out of your uterus. The process of labor usually starts when your baby is full-term, between 41 and 40 weeks of pregnancy. How will I know when I am close to going into labor? As your body prepares for labor and the birth of your baby, you may notice the following symptoms in the weeks and days before true labor starts:  Having a strong desire to get your home ready to receive your new baby. This is called nesting. Nesting may be a sign that labor is approaching, and it may occur several weeks before birth. Nesting may involve cleaning and organizing your home.  Passing a small amount of thick,  bloody mucus out of your vagina (normal bloody show or losing your mucus plug). This may happen more than a week before labor begins, or it  might occur right before labor begins as the opening of the cervix starts to widen (dilate). For some women, the entire mucus plug passes at once. For others, smaller portions of the mucus plug may gradually pass over several days.  Your baby moving (dropping) lower in your pelvis to get into position for birth (lightening). When this happens, you may feel more pressure on your bladder and pelvic bone and less pressure on your ribs. This may make it easier to breathe. It may also cause you to need to urinate more often and have problems with bowel movements.  Having "practice contractions" (Braxton Hicks contractions) that occur at irregular (unevenly spaced) intervals that are more than 10 minutes apart. This is also called false labor. False labor contractions are common after exercise or sexual activity, and they will stop if you change position, rest, or drink fluids. These contractions are usually mild and do not get stronger over time. They may feel like: ? A backache or back pain. ? Mild cramps, similar to menstrual cramps. ? Tightening or pressure in your abdomen. Other early symptoms that labor may be starting soon include:  Nausea or loss of appetite.  Diarrhea.  Having a sudden burst of energy, or feeling very tired.  Mood changes.  Having trouble sleeping. How will I know when labor has begun? Signs that true labor has begun may include:  Having contractions that come at regular (evenly spaced) intervals and increase in intensity. This may feel like more intense tightening or pressure in your abdomen that moves to your back. ? Contractions may also feel like rhythmic pain in your upper thighs or back that comes and goes at regular intervals. ? For first-time mothers, this change in intensity of contractions often occurs at a more gradual pace. ? Women who have given birth before may notice a more rapid progression of contraction changes.  Having a feeling of pressure in the  vaginal area.  Your water breaking (rupture of membranes). This is when the sac of fluid that surrounds your baby breaks. When this happens, you will notice fluid leaking from your vagina. This may be clear or blood-tinged. Labor usually starts within 24 hours of your water breaking, but it may take longer to begin. ? Some women notice this as a gush of fluid. ? Others notice that their underwear repeatedly becomes damp. Follow these instructions at home:   When labor starts, or if your water breaks, call your health care provider or nurse care line. Based on your situation, they will determine when you should go in for an exam.  When you are in early labor, you may be able to rest and manage symptoms at home. Some strategies to try at home include: ? Breathing and relaxation techniques. ? Taking a warm bath or shower. ? Listening to music. ? Using a heating pad on the lower back for pain. If you are directed to use heat:  Place a towel between your skin and the heat source.  Leave the heat on for 20-30 minutes.  Remove the heat if your skin turns bright red. This is especially important if you are unable to feel pain, heat, or cold. You may have a greater risk of getting burned. Get help right away if:  You have painful, regular contractions that are 5 minutes apart or less.  Labor starts before you are [redacted] weeks along in your pregnancy.  You have a fever.  You have a headache that does not go away.  You have bright red blood coming from your vagina.  You do not feel your baby moving.  You have a sudden onset of: ? Severe headache with vision problems. ? Nausea, vomiting, or diarrhea. ? Chest pain or shortness of breath. These symptoms may be an emergency. If your health care provider recommends that you go to the hospital or birth center where you plan to deliver, do not drive yourself. Have someone else drive you, or call emergency services (911 in the [place] Summary  Labor  is your body's natural process of moving your baby, placenta, and umbilical cord out of your uterus.  The process of labor usually starts when your baby is full-term, between 5 and 40 weeks of pregnancy.  When labor starts, or if your water breaks, call your health care provider or nurse care line. Based on your situation, they will determine when you should go in for an exam. This information is not intended to replace advice given to you by your health care provider. Make sure you discuss any questions you have with your health care provider. Document Revised: 03/26/2017 Document Reviewed: 12/01/2016 Elsevier Patient Education  2020 [employer].   Places to have your son circumcised (for self-pay patients//Medicaid now covers circumcisions):                                                                      [facility]     [phone]   $480 while you are in hospital         [facility]              [phone]   $269 by 4 wks                      [facility]                     [phone]   $269 by 7 days [facility]                    [phone]   $269 by 4 wks [facility]             [phone]   $225 by 2 wks    These prices sometimes change but are roughly what you can expect to pay. Please call and confirm pricing.   There are many reasons parents decide to have their sons circumsized. During the first year of life circumcised males have a reduced risk of urinary tract infections but after this year the rates between circumcised males and uncircumcised males are the same.  It can reduce rate of HIV and other STI infection later in life.  It is safe to have your son circumcised outside of the hospital and the places above perform them regularly.   Deciding about Circumcision in Baby Boys  (Up-to-date The Basics)  What is circumcision?   Circumcision is a surgery that removes the skin that covers the tip of the penis, called the "foreskin" Circumcision is usually done when a boy is between 1 and 10  days  old. In the [place], circumcision is common. In some other countries, fewer boys are circumcised. Circumcision is a common tradition in some religions.  Should I have my baby boy circumcised?   There is no easy answer. Circumcision has some benefits. But it also has risks. After talking with your doctor, you will have to decide for yourself what is right for your family.  What are the benefits of circumcision?   Circumcised boys seem to have slightly lower rates of: ?Urinary tract infections ?Swelling of the opening at the tip of the penis Circumcised men seem to have slightly lower rates of: ?Urinary tract infections ?Swelling of the opening at the tip of the penis ?Penis cancer ?HIV and other infections that you catch during sex, or transmit to a future partner(s) ?Cervical cancer in the women they have sex with Even so, in the [place], the risks of these problems are small - even in boys and men who have not been circumcised. Plus, boys and men who are not circumcised can reduce these extra risks by: ?Cleaning their penis well ?Using condoms during sex  What are the risks of circumcision?  Risks include: ?Bleeding or infection from the surgery ?Damage to or amputation of the penis ?A chance that the doctor will cut off too much or not enough of the foreskin ?A chance that sex won't feel as good later in life Only about 1 out of every 200 circumcisions leads to problems. There is also a chance that your health insurance won't pay for circumcision.  How is circumcision done in baby boys?  First, the baby gets medicine for pain relief. This might be a cream on the skin or a shot into the base of the penis. Next, the doctor cleans the baby's penis well. Then he or she uses special tools to cut off the foreskin. Finally, the doctor wraps a bandage (called gauze) around the baby's penis. If you have your baby circumcised, his doctor or nurse will give you instructions  on how to care for him after the surgery. It is important that you follow those instructions carefully.        Group B Streptococcus Test During Pregnancy Why am I having this test? Routine testing, also called screening, for group B streptococcus (GBS) is recommended for all pregnant women between the 36th and 37th week of pregnancy. GBS is a type of bacteria that can be passed from mother to baby during childbirth. Screening will help guide whether or not you will need treatment during labor and delivery to prevent complications such as:  An infection in your uterus during labor.  An infection in your uterus after delivery.  A serious infection in your baby after delivery, such as pneumonia, meningitis, or sepsis. GBS screening is not often done before 36 weeks of pregnancy unless you go into labor prematurely. What happens if I have group B streptococcus? If testing shows that you have GBS, your health care provider will recommend treatment with IV antibiotics during labor and delivery. This treatment significantly decreases the risk of complications for you and your baby. If you have a planned C-section and you have GBS, you may not need to be treated with antibiotics because GBS is usually passed to babies after labor starts and your water breaks. If you are in labor or your water breaks before your C-section, it is possible for GBS to get into your uterus and be passed to your baby, so you might need treatment. Is  there a chance I may not need to be tested? You may not need to be tested for GBS if:  You have a urine test that shows GBS before 36 to 37 weeks.  You had a baby with GBS infection after a previous delivery. In these cases, you will automatically be treated for GBS during labor and delivery. What is being tested? This test is done to check if you have group B streptococcus in your vagina or rectum. What kind of sample is taken? To collect samples for this test, your  health care provider will swab your vagina and rectum with a cotton swab. The sample is then sent to the lab to see if GBS is present. What happens during the test?   You will remove your clothing from the waist down.  You will lie down on an exam table in the same position as you would for a pelvic exam.  Your health care provider will swab your vagina and rectum to collect samples for a culture test.  You will be able to go home after the test and do all your usual activities. How are the results reported? The test results are reported as positive or negative. What do the results mean?  A positive test means you are at risk for passing GBS to your baby during labor and delivery. Your health care provider will recommend that you are treated with an IV antibiotic during labor and delivery.  A negative test means you are at very low risk of passing GBS to your baby. There is still a low risk of passing GBS to your baby because sometimes test results may report that you do not have a condition when you do (false-negative result) or there is a chance that you may become infected with GBS after the test is done. You most likely will not need to be treated with an antibiotic during labor and delivery. Talk with your health care provider about what your results mean. Questions to ask your health care provider Ask your health care provider, or the department that is doing the test:  When will my results be ready?  How will I get my results?  What are my treatment options? Summary  Routine testing (screening) for group B streptococcus (GBS) is recommended for all pregnant women between the 36th and 37th week of pregnancy.  GBS is a type of bacteria that can be passed from mother to baby during childbirth.  If testing shows that you have GBS, your health care provider will recommend that you are treated with IV antibiotics during labor and delivery. This treatment almost always prevents  infection in newborns. This information is not intended to replace advice given to you by your health care provider. Make sure you discuss any questions you have with your health care provider. Document Revised: 10/17/2018 Document Reviewed: 07/24/2018 Elsevier Patient Education  2020 [employer].        Intrauterine Device Information An intrauterine device (IUD) is a medical device that is inserted in the uterus to prevent pregnancy. It is a small, T-shaped device that has one or two nylon strings hanging down from it. The strings hang out of the lower part of the uterus (cervix) to allow for future IUD removal. There are two types of IUDs available:  Hormone IUD. This type of IUD is made of plastic and contains the hormone progestin (synthetic progesterone). A hormone IUD may last 3-5 years.  Copper IUD. This type of IUD has copper  wire wrapped around it. A copper IUD may last up to 10 years. How is an IUD inserted? An IUD is inserted through the vagina and placed into the uterus with a minor medical procedure. The exact procedure for IUD insertion may vary among health care providers and hospitals. How does an IUD work? Synthetic progesterone in a hormonal IUD prevents pregnancy by:  Thickening cervical mucus to prevent sperm from entering the uterus.  Thinning the uterine lining to prevent a fertilized egg from being implanted there. Copper in a copper IUD prevents pregnancy by making the uterus and fallopian tubes produce a fluid that kills sperm. What are the advantages of an IUD? Advantages of either type of IUD  It is highly effective in preventing pregnancy.  It is reversible. You can become pregnant shortly after the IUD is removed.  It is low-maintenance and can stay in place for a long time.  There are no estrogen-related side effects.  It can be used when breastfeeding.  It is not associated with weight gain.  It can be inserted right after childbirth, an  abortion, or a miscarriage. Advantages of a hormone IUD  If it is inserted within 7 days of your period starting, it works right after it is inserted. If the hormone IUD is inserted at any other time in your cycle, you will need to use a backup method of birth control for 7 days after insertion.  It can make menstrual periods lighter.  It can reduce menstrual cramping.  It can be used for 3-5 years. Advantages of a copper IUD  It works right after it is inserted.  It can be used as a form of emergency birth control if it is inserted within 5 days after having unprotected sex.  It does not interfere with your body's natural hormones.  It can be used for 10 years. What are the disadvantages of an IUD?  An IUD may cause irregular menstrual bleeding for a period of time after insertion.  You may have pain during insertion and have cramping and vaginal bleeding after insertion.  An IUD may cut the uterus (uterine perforation) when it is inserted. This is rare.  An IUD may cause pelvic inflammatory disease (PID), which is an infection in the uterus and fallopian tubes. This is rare, and it usually happens during the first 20 days after the IUD is inserted.  A copper IUD can make your menstrual flow heavier and more painful. How is an IUD removed?  You will lie on your back with your knees bent and your feet in footrests (stirrups).  A device will be inserted into your vagina to spread apart the vaginal walls (speculum). This will allow your health care provider to see the strings attached to the IUD.  Your health care provider will use a small instrument (forceps) to grasp the IUD strings and pull firmly until the IUD is removed. You may have some discomfort when the IUD is removed. Your health care provider may recommend taking over-the-counter pain relievers, such as ibuprofen, before the procedure. You may also have minor spotting for a few days after the procedure. The exact  procedure for IUD removal may vary among health care providers and hospitals. Is the IUD right for me? Your health care provider will make sure you are a good candidate for an IUD and will discuss the advantages, disadvantages, and possible side effects with you. Summary  An intrauterine device (IUD) is a medical device that is inserted in  the uterus to prevent pregnancy. It is a small, T-shaped device that has one or two nylon strings hanging down from it.  A hormone IUD contains the hormone progestin (synthetic progesterone). A copper IUD has copper wire wrapped around it.  Synthetic progesterone in a hormone IUD prevents pregnancy by thickening cervical mucus and thinning the walls of the uterus. Copper in a copper IUD prevents pregnancy by making the uterus and fallopian tubes produce a fluid that kills sperm.  A hormone IUD can be left in place for 3-5 years. A copper IUD can be left in place for up to 10 years.  An IUD is inserted and removed by a health care provider. You may feel some pain during insertion and removal. Your health care provider may recommend taking over-the-counter pain medicine, such as ibuprofen, before an IUD procedure. This information is not intended to replace advice given to you by your health care provider. Make sure you discuss any questions you have with your health care provider. Document Revised: 06/08/2017 Document Reviewed: 07/25/2016 Elsevier Patient Education  2020 [employer]

## 2020-06-16 NOTE — Progress Notes (Addendum)
Subjective:  Kimberly Fox is a 24 y.o. G2P1001 at [redacted]w[redacted]d being seen today for ongoing prenatal care.  She is currently monitored for the following issues for this high-risk pregnancy and has History of laparoscopic cholecystectomy; Chronic calculous cholecystitis; Supervision of low-risk pregnancy; and DM (diabetes mellitus), gestational on their problem list.  Patient reports pt reports occasional palpitations for the past few weeks. Patient reports slight SOB, lightheadedness, that lasts only for a few minutes and then resolves without complication.  Contractions: Irritability. Vag. Bleeding: None.  Movement: Present. Denies leaking of fluid.   The following portions of the patient's history were reviewed and updated as appropriate: allergies, current medications, past family history, past medical history, past social history, past surgical history and problem list. Problem list updated.  Objective:   Vitals:   06/16/20 1516  BP: 106/64  Pulse: 93  Weight: 159 lb (72.1 kg)    Fetal Status: Fetal Heart Rate (bpm): 142   Movement: Present     General:  Alert, oriented and cooperative. Patient is in no acute distress.  Skin: Skin is warm and dry. No rash noted.   Cardiovascular: Normal heart rate noted, normal rate and rhythm on auscultation  Respiratory: Normal respiratory effort, no problems with respiration noted  Abdomen: Soft, gravid, appropriate for gestational age. Pain/Pressure: Present     Pelvic: Vag. Bleeding: None     Cervical exam deferred        Extremities: Normal range of motion.  Edema: None  Mental Status: Normal mood and affect. Normal behavior. Normal judgment and thought content.   Urinalysis:      Assessment and Plan:  Pregnancy: G2P1001 at [redacted]w[redacted]d  1. Encounter for supervision of low-risk pregnancy in third trimester -discussed contraception, info given, pt [facility] Paragard I[facility] -circumcision info given  2. Gestational diabetes  mellitus (GDM) in third trimester, gestational diabetes method of control unspecified -diet-controlled, but elevated -fasting 80-124 (40% elevated), 2hr pprandial 81-144 - 17% elevated since visit 06/02/2020 -patient missing most pprandial numbers since visit 06/02/2020; patient reports the single time she tried to check her own blood sugar she passed out and has not tried again since and is not willing to do so. Her husband checks her blood sugar but he works and cannot check it for all meals -consulted with Dr. Crissie Reese, will start on Metformin 500mg  BID, will need to return in one week and will need BPP ASAP -pt had growth scan today (results not yet available)  3. History of laparoscopic cholecystectomy  4. Chronic calculous cholecystitis  Term labor symptoms and general obstetric precautions including but not limited to vaginal bleeding, contractions, leaking of fluid and fetal movement were reviewed in detail with the patient. I discussed the assessment and treatment plan with the patient. The patient was provided an opportunity to ask questions and all were answered. The patient agreed with the plan and demonstrated an understanding of the instructions. The patient was advised to call back or seek an in-person office evaluation/go to [facility] at [facility] for any urgent or concerning symptoms. Please refer to After Visit Summary for other counseling recommendations.  Return in about 1 week (around 06/23/2020) for in-person HOB/MD ONLY, needs BPP ASAP.   Conley Pawling, 06/25/2020, NP

## 2020-06-17 LAB — CERVICOVAGINAL ANCILLARY ONLY
Chlamydia: NEGATIVE
Comment: NEGATIVE
Comment: NORMAL
Neisseria Gonorrhea: NEGATIVE

## 2020-06-19 LAB — CULTURE, BETA STREP (GROUP B ONLY): Strep Gp B Culture: POSITIVE — AB

## 2020-06-21 ENCOUNTER — Encounter: Payer: Self-pay | Admitting: Women's Health

## 2020-06-21 DIAGNOSIS — B951 Streptococcus, group B, as the cause of diseases classified elsewhere: Secondary | ICD-10-CM | POA: Insufficient documentation

## 2020-06-23 ENCOUNTER — Ambulatory Visit (INDEPENDENT_AMBULATORY_CARE_PROVIDER_SITE_OTHER): Payer: Self-pay

## 2020-06-23 ENCOUNTER — Ambulatory Visit (INDEPENDENT_AMBULATORY_CARE_PROVIDER_SITE_OTHER): Payer: Self-pay | Admitting: Family Medicine

## 2020-06-23 ENCOUNTER — Ambulatory Visit (INDEPENDENT_AMBULATORY_CARE_PROVIDER_SITE_OTHER): Payer: Self-pay | Admitting: *Deleted

## 2020-06-23 ENCOUNTER — Encounter: Payer: Self-pay | Admitting: Family Medicine

## 2020-06-23 ENCOUNTER — Other Ambulatory Visit: Payer: Self-pay

## 2020-06-23 VITALS — BP 126/73 | HR 104 | Wt 154.0 lb

## 2020-06-23 DIAGNOSIS — Z3493 Encounter for supervision of normal pregnancy, unspecified, third trimester: Secondary | ICD-10-CM

## 2020-06-23 DIAGNOSIS — O24415 Gestational diabetes mellitus in pregnancy, controlled by oral hypoglycemic drugs: Secondary | ICD-10-CM

## 2020-06-23 DIAGNOSIS — O98819 Other maternal infectious and parasitic diseases complicating pregnancy, unspecified trimester: Secondary | ICD-10-CM

## 2020-06-23 DIAGNOSIS — O24419 Gestational diabetes mellitus in pregnancy, unspecified control: Secondary | ICD-10-CM

## 2020-06-23 DIAGNOSIS — B951 Streptococcus, group B, as the cause of diseases classified elsewhere: Secondary | ICD-10-CM

## 2020-06-23 NOTE — Progress Notes (Signed)
Pt informed that the ultrasound is considered a limited OB ultrasound and is not intended to be a complete ultrasound exam.  Patient also informed that the ultrasound is not being completed with the intent of assessing for fetal or placental anomalies or any pelvic abnormalities.  Explained that the purpose of today's ultrasound is to assess for presentation, BPP and amniotic fluid volume.  Patient acknowledges the purpose of the exam and the limitations of the study.    IOL scheduled on 12/27 in AM

## 2020-06-23 NOTE — Patient Instructions (Signed)
 Third Trimester of Pregnancy The third trimester is from week 28 through week 40 (months 7 through 9). The third trimester is a time when the unborn baby (fetus) is growing rapidly. At the end of the ninth month, the fetus is about 20 inches in length and weighs 6-10 pounds. Body changes during your third trimester Your body will continue to go through many changes during pregnancy. The changes vary from woman to woman. During the third trimester:  Your weight will continue to increase. You can expect to gain 25-35 pounds (11-16 kg) by the end of the pregnancy.  You may begin to get stretch marks on your hips, abdomen, and breasts.  You may urinate more often because the fetus is moving lower into your pelvis and pressing on your bladder.  You may develop or continue to have heartburn. This is caused by increased hormones that slow down muscles in the digestive tract.  You may develop or continue to have constipation because increased hormones slow digestion and cause the muscles that push waste through your intestines to relax.  You may develop hemorrhoids. These are swollen veins (varicose veins) in the rectum that can itch or be painful.  You may develop swollen, bulging veins (varicose veins) in your legs.  You may have increased body aches in the pelvis, back, or thighs. This is due to weight gain and increased hormones that are relaxing your joints.  You may have changes in your hair. These can include thickening of your hair, rapid growth, and changes in texture. Some women also have hair loss during or after pregnancy, or hair that feels dry or thin. Your hair will most likely return to normal after your baby is born.  Your breasts will continue to grow and they will continue to become tender. A yellow fluid (colostrum) may leak from your breasts. This is the first milk you are producing for your baby.  Your belly button may stick out.  You may notice more swelling in your  hands, face, or ankles.  You may have increased tingling or numbness in your hands, arms, and legs. The skin on your belly may also feel numb.  You may feel short of breath because of your expanding uterus.  You may have more problems sleeping. This can be caused by the size of your belly, increased need to urinate, and an increase in your body's metabolism.  You may notice the fetus "dropping," or moving lower in your abdomen (lightening).  You may have increased vaginal discharge.  You may notice your joints feel loose and you may have pain around your pelvic bone. What to expect at prenatal visits You will have prenatal exams every 2 weeks until week 36. Then you will have weekly prenatal exams. During a routine prenatal visit:  You will be weighed to make sure you and the baby are growing normally.  Your blood pressure will be taken.  Your abdomen will be measured to track your baby's growth.  The fetal heartbeat will be listened to.  Any test results from the previous visit will be discussed.  You may have a cervical check near your due date to see if your cervix has softened or thinned (effaced).  You will be tested for Group B streptococcus. This happens between 35 and 37 weeks. Your health care provider may ask you:  What your birth plan is.  How you are feeling.  If you are feeling the baby move.  If you have had any   abnormal symptoms, such as leaking fluid, bleeding, severe headaches, or abdominal cramping.  If you are using any tobacco products, including cigarettes, chewing tobacco, and electronic cigarettes.  If you have any questions. Other tests or screenings that may be performed during your third trimester include:  Blood tests that check for low iron levels (anemia).  Fetal testing to check the health, activity level, and growth of the fetus. Testing is done if you have certain medical conditions or if there are problems during the  pregnancy.  Nonstress test (NST). This test checks the health of your baby to make sure there are no signs of problems, such as the baby not getting enough oxygen. During this test, a belt is placed around your belly. The baby is made to move, and its heart rate is monitored during movement. What is false labor? False labor is a condition in which you feel small, irregular tightenings of the muscles in the womb (contractions) that usually go away with rest, changing position, or drinking water. These are called Braxton Hicks contractions. Contractions may last for hours, days, or even weeks before true labor sets in. If contractions come at regular intervals, become more frequent, increase in intensity, or become painful, you should see your health care provider. What are the signs of labor?  Abdominal cramps.  Regular contractions that start at 10 minutes apart and become stronger and more frequent with time.  Contractions that start on the top of the uterus and spread down to the lower abdomen and back.  Increased pelvic pressure and dull back pain.  A watery or bloody mucus discharge that comes from the vagina.  Leaking of amniotic fluid. This is also known as your "water breaking." It could be a slow trickle or a gush. Let your health care provider know if it has a color or strange odor. If you have any of these signs, call your health care provider right away, even if it is before your due date. Follow these instructions at home: Medicines  Follow your health care provider's instructions regarding medicine use. Specific medicines may be either safe or unsafe to take during pregnancy.  Take a prenatal vitamin that contains at least 600 micrograms (mcg) of folic acid.  If you develop constipation, try taking a stool softener if your health care provider approves. Eating and drinking   Eat a balanced diet that includes fresh fruits and vegetables, whole grains, good sources of protein  such as meat, eggs, or tofu, and low-fat dairy. Your health care provider will help you determine the amount of weight gain that is right for you.  Avoid raw meat and uncooked cheese. These carry germs that can cause birth defects in the baby.  If you have low calcium intake from food, talk to your health care provider about whether you should take a daily calcium supplement.  Eat four or five small meals rather than three large meals a day.  Limit foods that are high in fat and processed sugars, such as fried and sweet foods.  To prevent constipation: ? Drink enough fluid to keep your urine clear or pale yellow. ? Eat foods that are high in fiber, such as fresh fruits and vegetables, whole grains, and beans. Activity  Exercise only as directed by your health care provider. Most women can continue their usual exercise routine during pregnancy. Try to exercise for 30 minutes at least 5 days a week. Stop exercising if you experience uterine contractions.  Avoid heavy lifting.    Do not exercise in extreme heat or humidity, or at high altitudes.  Wear low-heel, comfortable shoes.  Practice good posture.  You may continue to have sex unless your health care provider tells you otherwise. Relieving pain and discomfort  Take frequent breaks and rest with your legs elevated if you have leg cramps or low back pain.  Take warm sitz baths to soothe any pain or discomfort caused by hemorrhoids. Use hemorrhoid cream if your health care provider approves.  Wear a good support bra to prevent discomfort from breast tenderness.  If you develop varicose veins: ? Wear support pantyhose or compression stockings as told by your healthcare provider. ? Elevate your feet for 15 minutes, 3-4 times a day. Prenatal care  Write down your questions. Take them to your prenatal visits.  Keep all your prenatal visits as told by your health care provider. This is important. Safety  Wear your seat belt at  all times when driving.  Make a list of emergency phone numbers, including numbers for family, friends, the hospital, and police and fire departments. General instructions  Avoid cat litter boxes and soil used by cats. These carry germs that can cause birth defects in the baby. If you have a cat, ask someone to clean the litter box for you.  Do not travel far distances unless it is absolutely necessary and only with the approval of your health care provider.  Do not use hot tubs, steam rooms, or saunas.  Do not drink alcohol.  Do not use any products that contain nicotine or tobacco, such as cigarettes and e-cigarettes. If you need help quitting, ask your health care provider.  Do not use any medicinal herbs or unprescribed drugs. These chemicals affect the formation and growth of the baby.  Do not douche or use tampons or scented sanitary pads.  Do not cross your legs for long periods of time.  To prepare for the arrival of your baby: ? Take prenatal classes to understand, practice, and ask questions about labor and delivery. ? Make a trial run to the hospital. ? Visit the hospital and tour the maternity area. ? Arrange for maternity or paternity leave through employers. ? Arrange for family and friends to take care of pets while you are in the hospital. ? Purchase a rear-facing car seat and make sure you know how to install it in your car. ? Pack your hospital bag. ? Prepare the baby's nursery. Make sure to remove all pillows and stuffed animals from the baby's crib to prevent suffocation.  Visit your dentist if you have not gone during your pregnancy. Use a soft toothbrush to brush your teeth and be gentle when you floss. Contact a health care provider if:  You are unsure if you are in labor or if your water has broken.  You become dizzy.  You have mild pelvic cramps, pelvic pressure, or nagging pain in your abdominal area.  You have lower back pain.  You have persistent  nausea, vomiting, or diarrhea.  You have an unusual or bad smelling vaginal discharge.  You have pain when you urinate. Get help right away if:  Your water breaks before 37 weeks.  You have regular contractions less than 5 minutes apart before 37 weeks.  You have a fever.  You are leaking fluid from your vagina.  You have spotting or bleeding from your vagina.  You have severe abdominal pain or cramping.  You have rapid weight loss or weight gain.  You   have shortness of breath with chest pain.  You notice sudden or extreme swelling of your face, hands, ankles, feet, or legs.  Your baby makes fewer than 10 movements in 2 hours.  You have severe headaches that do not go away when you take medicine.  You have vision changes. Summary  The third trimester is from week 28 through week 40, months 7 through 9. The third trimester is a time when the unborn baby (fetus) is growing rapidly.  During the third trimester, your discomfort may increase as you and your baby continue to gain weight. You may have abdominal, leg, and back pain, sleeping problems, and an increased need to urinate.  During the third trimester your breasts will keep growing and they will continue to become tender. A yellow fluid (colostrum) may leak from your breasts. This is the first milk you are producing for your baby.  False labor is a condition in which you feel small, irregular tightenings of the muscles in the womb (contractions) that eventually go away. These are called Braxton Hicks contractions. Contractions may last for hours, days, or even weeks before true labor sets in.  Signs of labor can include: abdominal cramps; regular contractions that start at 10 minutes apart and become stronger and more frequent with time; watery or bloody mucus discharge that comes from the vagina; increased pelvic pressure and dull back pain; and leaking of amniotic fluid. This information is not intended to replace advice  given to you by your health care provider. Make sure you discuss any questions you have with your health care provider. Document Revised: 10/17/2018 Document Reviewed: 08/01/2016 [employer] Patient Education  2020 [employer]   Breastfeeding  Choosing to breastfeed is one of the best decisions you can make for yourself and your baby. A change in hormones during pregnancy causes your breasts to make breast milk in your milk-producing glands. Hormones prevent breast milk from being released before your baby is born. They also prompt milk flow after birth. Once breastfeeding has begun, thoughts of your baby, as well as his or her sucking or crying, can stimulate the release of milk from your milk-producing glands. Benefits of breastfeeding Research shows that breastfeeding offers many health benefits for infants and mothers. It also offers a cost-free and convenient way to feed your baby. For your baby  Your first milk (colostrum) helps your baby's digestive system to function better.  Special cells in your milk (antibodies) help your baby to fight off infections.  Breastfed babies are less likely to develop asthma, allergies, obesity, or type 2 diabetes. They are also at lower risk for sudden infant death syndrome (SIDS).  Nutrients in breast milk are better able to meet your baby's needs compared to infant formula.  Breast milk improves your baby's brain development. For you  Breastfeeding helps to create a very special bond between you and your baby.  Breastfeeding is convenient. Breast milk costs nothing and is always available at the correct temperature.  Breastfeeding helps to burn calories. It helps you to lose the weight that you gained during pregnancy.  Breastfeeding makes your uterus return faster to its size before pregnancy. It also slows bleeding (lochia) after you give birth.  Breastfeeding helps to lower your risk of developing type 2 diabetes, osteoporosis, rheumatoid  arthritis, cardiovascular disease, and breast, ovarian, uterine, and endometrial cancer later in life. Breastfeeding basics Starting breastfeeding  Find a comfortable place to sit or lie down, with your neck and back well-supported.  Place   a pillow or a rolled-up blanket under your baby to bring him or her to the level of your breast (if you are seated). Nursing pillows are specially designed to help support your arms and your baby while you breastfeed.  Make sure that your baby's tummy (abdomen) is facing your abdomen.  Gently massage your breast. With your fingertips, massage from the outer edges of your breast inward toward the nipple. This encourages milk flow. If your milk flows slowly, you may need to continue this action during the feeding.  Support your breast with 4 fingers underneath and your thumb above your nipple (make the letter "C" with your hand). Make sure your fingers are well away from your nipple and your baby's mouth.  Stroke your baby's lips gently with your finger or nipple.  When your baby's mouth is open wide enough, quickly bring your baby to your breast, placing your entire nipple and as much of the areola as possible into your baby's mouth. The areola is the colored area around your nipple. ? More areola should be visible above your baby's upper lip than below the lower lip. ? Your baby's lips should be opened and extended outward (flanged) to ensure an adequate, comfortable latch. ? Your baby's tongue should be between his or her lower gum and your breast.  Make sure that your baby's mouth is correctly positioned around your nipple (latched). Your baby's lips should create a seal on your breast and be turned out (everted).  It is common for your baby to suck about 2-3 minutes in order to start the flow of breast milk. Latching Teaching your baby how to latch onto your breast properly is very important. An improper latch can cause nipple pain, decreased milk  supply, and poor weight gain in your baby. Also, if your baby is not latched onto your nipple properly, he or she may swallow some air during feeding. This can make your baby fussy. Burping your baby when you switch breasts during the feeding can help to get rid of the air. However, teaching your baby to latch on properly is still the best way to prevent fussiness from swallowing air while breastfeeding. Signs that your baby has successfully latched onto your nipple  Silent tugging or silent sucking, without causing you pain. Infant's lips should be extended outward (flanged).  Swallowing heard between every 3-4 sucks once your milk has started to flow (after your let-down milk reflex occurs).  Muscle movement above and in front of his or her ears while sucking. Signs that your baby has not successfully latched onto your nipple  Sucking sounds or smacking sounds from your baby while breastfeeding.  Nipple pain. If you think your baby has not latched on correctly, slip your finger into the corner of your baby's mouth to break the suction and place it between your baby's gums. Attempt to start breastfeeding again. Signs of successful breastfeeding Signs from your baby  Your baby will gradually decrease the number of sucks or will completely stop sucking.  Your baby will fall asleep.  Your baby's body will relax.  Your baby will retain a small amount of milk in his or her mouth.  Your baby will let go of your breast by himself or herself. Signs from you  Breasts that have increased in firmness, weight, and size 1-3 hours after feeding.  Breasts that are softer immediately after breastfeeding.  Increased milk volume, as well as a change in milk consistency and color by the fifth   day of breastfeeding.  Nipples that are not sore, cracked, or bleeding. Signs that your baby is getting enough milk  Wetting at least 1-2 diapers during the first 24 hours after birth.  Wetting at least 5-6  diapers every 24 hours for the first week after birth. The urine should be clear or pale yellow by the age of 5 days.  Wetting 6-8 diapers every 24 hours as your baby continues to grow and develop.  At least 3 stools in a 24-hour period by the age of 5 days. The stool should be soft and yellow.  At least 3 stools in a 24-hour period by the age of 7 days. The stool should be seedy and yellow.  No loss of weight greater than 10% of birth weight during the first 3 days of life.  Average weight gain of 4-7 oz (113-198 g) per week after the age of 4 days.  Consistent daily weight gain by the age of 5 days, without weight loss after the age of 2 weeks. After a feeding, your baby may spit up a small amount of milk. This is normal. Breastfeeding frequency and duration Frequent feeding will help you make more milk and can prevent sore nipples and extremely full breasts (breast engorgement). Breastfeed when you feel the need to reduce the fullness of your breasts or when your baby shows signs of hunger. This is called "breastfeeding on demand." Signs that your baby is hungry include:  Increased alertness, activity, or restlessness.  Movement of the head from side to side.  Opening of the mouth when the corner of the mouth or cheek is stroked (rooting).  Increased sucking sounds, smacking lips, cooing, sighing, or squeaking.  Hand-to-mouth movements and sucking on fingers or hands.  Fussing or crying. Avoid introducing a pacifier to your baby in the first 4-6 weeks after your baby is born. After this time, you may choose to use a pacifier. Research has shown that pacifier use during the first year of a baby's life decreases the risk of sudden infant death syndrome (SIDS). Allow your baby to feed on each breast as long as he or she wants. When your baby unlatches or falls asleep while feeding from the first breast, offer the second breast. Because newborns are often sleepy in the first few weeks of  life, you may need to awaken your baby to get him or her to feed. Breastfeeding times will vary from baby to baby. However, the following rules can serve as a guide to help you make sure that your baby is properly fed:  Newborns (babies 4 weeks of age or younger) may breastfeed every 1-3 hours.  Newborns should not go without breastfeeding for longer than 3 hours during the day or 5 hours during the night.  You should breastfeed your baby a minimum of 8 times in a 24-hour period. Breast milk pumping     Pumping and storing breast milk allows you to make sure that your baby is exclusively fed your breast milk, even at times when you are unable to breastfeed. This is especially important if you go back to work while you are still breastfeeding, or if you are not able to be present during feedings. Your lactation consultant can help you find a method of pumping that works best for you and give you guidelines about how long it is safe to store breast milk. Caring for your breasts while you breastfeed Nipples can become dry, cracked, and sore while breastfeeding. The following   recommendations can help keep your breasts moisturized and healthy:  Avoid using soap on your nipples.  Wear a supportive bra designed especially for nursing. Avoid wearing underwire-style bras or extremely tight bras (sports bras).  Air-dry your nipples for 3-4 minutes after each feeding.  Use only cotton bra pads to absorb leaked breast milk. Leaking of breast milk between feedings is normal.  Use lanolin on your nipples after breastfeeding. Lanolin helps to maintain your skin's normal moisture barrier. Pure lanolin is not harmful (not toxic) to your baby. You may also hand express a few drops of breast milk and gently massage that milk into your nipples and allow the milk to air-dry. In the first few weeks after giving birth, some women experience breast engorgement. Engorgement can make your breasts feel heavy, warm,  and tender to the touch. Engorgement peaks within 3-5 days after you give birth. The following recommendations can help to ease engorgement:  Completely empty your breasts while breastfeeding or pumping. You may want to start by applying warm, moist heat (in the shower or with warm, water-soaked hand towels) just before feeding or pumping. This increases circulation and helps the milk flow. If your baby does not completely empty your breasts while breastfeeding, pump any extra milk after he or she is finished.  Apply ice packs to your breasts immediately after breastfeeding or pumping, unless this is too uncomfortable for you. To do this: ? Put ice in a plastic bag. ? Place a towel between your skin and the bag. ? Leave the ice on for 20 minutes, 2-3 times a day.  Make sure that your baby is latched on and positioned properly while breastfeeding. If engorgement persists after 48 hours of following these recommendations, contact your health care provider or a lactation consultant. Overall health care recommendations while breastfeeding  Eat 3 healthy meals and 3 snacks every day. Well-nourished mothers who are breastfeeding need an additional 450-500 calories a day. You can meet this requirement by increasing the amount of a balanced diet that you eat.  Drink enough water to keep your urine pale yellow or clear.  Rest often, relax, and continue to take your prenatal vitamins to prevent fatigue, stress, and low vitamin and mineral levels in your body (nutrient deficiencies).  Do not use any products that contain nicotine or tobacco, such as cigarettes and e-cigarettes. Your baby may be harmed by chemicals from cigarettes that pass into breast milk and exposure to secondhand smoke. If you need help quitting, ask your health care provider.  Avoid alcohol.  Do not use illegal drugs or marijuana.  Talk with your health care provider before taking any medicines. These include over-the-counter and  prescription medicines as well as vitamins and herbal supplements. Some medicines that may be harmful to your baby can pass through breast milk.  It is possible to become pregnant while breastfeeding. If birth control is desired, ask your health care provider about options that will be safe while breastfeeding your baby. Where to find more information: [employer]: www.llli.org Contact a health care provider if:  You feel like you want to stop breastfeeding or have become frustrated with breastfeeding.  Your nipples are cracked or bleeding.  Your breasts are red, tender, or warm.  You have: ? Painful breasts or nipples. ? A swollen area on either breast. ? A fever or chills. ? Nausea or vomiting. ? Drainage other than breast milk from your nipples.  Your breasts do not become full before feedings   by the fifth day after you give birth.  You feel sad and depressed.  Your baby is: ? Too sleepy to eat well. ? Having trouble sleeping. ? More than 1 week old and wetting fewer than 6 diapers in a 24-hour period. ? Not gaining weight by 5 days of age.  Your baby has fewer than 3 stools in a 24-hour period.  Your baby's skin or the white parts of his or her eyes become yellow. Get help right away if:  Your baby is overly tired (lethargic) and does not want to wake up and feed.  Your baby develops an unexplained fever. Summary  Breastfeeding offers many health benefits for infant and mothers.  Try to breastfeed your infant when he or she shows early signs of hunger.  Gently tickle or stroke your baby's lips with your finger or nipple to allow the baby to open his or her mouth. Bring the baby to your breast. Make sure that much of the areola is in your baby's mouth. Offer one side and burp the baby before you offer the other side.  Talk with your health care provider or lactation consultant if you have questions or you face problems as you breastfeed. This  information is not intended to replace advice given to you by your health care provider. Make sure you discuss any questions you have with your health care provider. Document Revised: 09/20/2017 Document Reviewed: 07/28/2016 Elsevier Patient Education  2020 [employer]  

## 2020-06-23 NOTE — Progress Notes (Signed)
   Subjective:  Kimberly Fox is a 24 y.o. G2P1001 at [redacted]w[redacted]d being seen today for ongoing prenatal care.  She is currently monitored for the following issues for this high-risk pregnancy and has History of laparoscopic cholecystectomy; Chronic calculous cholecystitis; Supervision of low-risk pregnancy; DM (diabetes mellitus), gestational; and Group B streptococcal infection during pregnancy on their problem list.  Patient reports no complaints.  Contractions: Not present. Vag. Bleeding: None.  Movement: Present. Denies leaking of fluid.   The following portions of the patient's history were reviewed and updated as appropriate: allergies, current medications, past family history, past medical history, past social history, past surgical history and problem list. Problem list updated.  Objective:   Vitals:   06/23/20 1436  BP: 126/73  Pulse: (!) 104  Weight: 154 lb (69.9 kg)    Fetal Status: Fetal Heart Rate (bpm): NST   Movement: Present     General:  Alert, oriented and cooperative. Patient is in no acute distress.  Skin: Skin is warm and dry. No rash noted.   Cardiovascular: Normal heart rate noted  Respiratory: Normal respiratory effort, no problems with respiration noted  Abdomen: Soft, gravid, appropriate for gestational age. Pain/Pressure: Present     Pelvic: Vag. Bleeding: None     Cervical exam deferred        Extremities: Normal range of motion.  Edema: None  Mental Status: Normal mood and affect. Normal behavior. Normal judgment and thought content.   Urinalysis:      Assessment and Plan:  Pregnancy: G2P1001 at [redacted]w[redacted]d  1. Encounter for supervision of low-risk pregnancy in third trimester BP normal, NST pending Discussed IOL at 39 weeks if not yet delivered, she is in agreement with plan Will schedule for Monday after Christmas, IOL orders placed, form to be faxed  2. Group B streptococcal infection during pregnancy Discussed need for prophylaxis during  labor  3. Gestational diabetes mellitus (GDM) in third trimester, gestational diabetes method of control unspecified Started on Metformin 500mg  BID at last visit due to 40% of sugars being elevated Sugars today are well controlled Getting NST/BPP today Last growth 06/16/2020 w EFW 42%, normal AFI  Term labor symptoms and general obstetric precautions including but not limited to vaginal bleeding, contractions, leaking of fluid and fetal movement were reviewed in detail with the patient. Please refer to After Visit Summary for other counseling recommendations.  Return in 1 week (on 06/30/2020) for [facility], ob visit.   [facility], MD

## 2020-06-24 ENCOUNTER — Ambulatory Visit: Payer: Self-pay | Admitting: Internal Medicine

## 2020-06-24 ENCOUNTER — Telehealth (HOSPITAL_COMMUNITY): Payer: Self-pay | Admitting: *Deleted

## 2020-06-24 ENCOUNTER — Encounter (HOSPITAL_COMMUNITY): Payer: Self-pay | Admitting: *Deleted

## 2020-06-24 NOTE — Telephone Encounter (Signed)
Preadmission screen  

## 2020-06-25 ENCOUNTER — Ambulatory Visit: Payer: Self-pay

## 2020-06-25 ENCOUNTER — Encounter: Payer: Self-pay | Admitting: *Deleted

## 2020-06-27 ENCOUNTER — Other Ambulatory Visit: Payer: Self-pay | Admitting: Advanced Practice Midwife

## 2020-06-28 ENCOUNTER — Other Ambulatory Visit: Payer: Self-pay | Admitting: Advanced Practice Midwife

## 2020-06-30 ENCOUNTER — Other Ambulatory Visit: Payer: Self-pay

## 2020-06-30 ENCOUNTER — Ambulatory Visit (INDEPENDENT_AMBULATORY_CARE_PROVIDER_SITE_OTHER): Payer: Self-pay

## 2020-06-30 ENCOUNTER — Ambulatory Visit (INDEPENDENT_AMBULATORY_CARE_PROVIDER_SITE_OTHER): Payer: Self-pay | Admitting: Family Medicine

## 2020-06-30 ENCOUNTER — Ambulatory Visit (INDEPENDENT_AMBULATORY_CARE_PROVIDER_SITE_OTHER): Payer: Self-pay | Admitting: *Deleted

## 2020-06-30 VITALS — BP 116/59 | HR 103 | Wt 160.4 lb

## 2020-06-30 DIAGNOSIS — O24415 Gestational diabetes mellitus in pregnancy, controlled by oral hypoglycemic drugs: Secondary | ICD-10-CM

## 2020-06-30 DIAGNOSIS — O98819 Other maternal infectious and parasitic diseases complicating pregnancy, unspecified trimester: Secondary | ICD-10-CM

## 2020-06-30 DIAGNOSIS — Z3493 Encounter for supervision of normal pregnancy, unspecified, third trimester: Secondary | ICD-10-CM

## 2020-06-30 DIAGNOSIS — B951 Streptococcus, group B, as the cause of diseases classified elsewhere: Secondary | ICD-10-CM

## 2020-06-30 DIAGNOSIS — O0993 Supervision of high risk pregnancy, unspecified, third trimester: Secondary | ICD-10-CM

## 2020-06-30 LAB — POCT URINALYSIS DIP (DEVICE)
Bilirubin Urine: NEGATIVE
Glucose, UA: NEGATIVE mg/dL
Hgb urine dipstick: NEGATIVE
Ketones, ur: NEGATIVE mg/dL
Leukocytes,Ua: NEGATIVE
Nitrite: NEGATIVE
Protein, ur: NEGATIVE mg/dL
Specific Gravity, Urine: >=1.03 (ref 1.005–1.030)
Urobilinogen, UA: 0.2 mg/dL (ref 0.0–1.0)
pH: 6.5 (ref 5.0–8.0)

## 2020-06-30 NOTE — Progress Notes (Signed)

## 2020-06-30 NOTE — Patient Instructions (Signed)
 Eleccin del mtodo anticonceptivo Contraception Choices La anticoncepcin, o los mtodos anticonceptivos, hace referencia a los mtodos o dispositivos que evitan el embarazo. Mtodos hormonales Implante anticonceptivo  Un implante anticonceptivo consiste en un tubo plstico delgado que contiene una hormona. Se inserta en la parte superior del brazo. Puede permanecer en el lugar hasta por 3 aos. Inyecciones de progestina sola Las inyecciones de progestina sola contienen progestina, una forma sinttica de la hormona progesterona. Un mdico las administra cada 3 meses. Pldoras anticonceptivas  Las pldoras anticonceptivas son pastillas que contienen hormonas que evitan el embarazo. Deben tomarse una vez al da, preferentemente a la misma hora cada da. Parches anticonceptivos  El parche anticonceptivo contiene hormonas que evitan el embarazo. Se coloca en la piel, debe cambiarse una vez a la semana durante tres semanas y debe retirarse en la cuarta semana. Se necesita una receta para utilizar este mtodo anticonceptivo. Anillo vaginal  Un anillo vaginal contiene hormonas que evitan el embarazo. Se coloca en la vagina durante tres semanas y se retira en la cuarta semana. Luego se repite el proceso con un anillo nuevo. Se necesita una receta para utilizar este mtodo anticonceptivo. Anticonceptivo de emergencia Los anticonceptivos de emergencia son mtodos para evitar un embarazo despus de tener sexo sin proteccin. Vienen en forma de pldora y pueden tomarse hasta 5 das despus de tener sexo. Funcionan mejor cuando se toman lo ms pronto posible luego de tener sexo. La mayora de los anticonceptivos de emergencia estn disponibles sin receta mdica. Este mtodo no debe utilizarse como el nico mtodo anticonceptivo. Mtodos de barrera Preservativo masculino  Un preservativo masculino es una vaina delgada que se coloca sobre el pene durante el sexo. Los preservativos evitan que el esperma  ingrese en el cuerpo de la mujer. Pueden utilizarse con un espermicida para aumentar la efectividad. Deben desecharse luego de su uso. Preservativo femenino  Un preservativo femenino es una vaina blanda y holgada que se coloca en la vagina antes de tener sexo. El preservativo evita que el esperma ingrese en el cuerpo de la mujer. Deben desecharse luego de su uso. Diafragma  Un diafragma es una barrera blanda con forma de cpula. Se inserta en la vagina antes del sexo, junto con un espermicida. El diafragma bloquea el ingreso de esperma en el tero, y el espermicida mata a los espermatozoides. El diafragma debe permanecer en la vagina durante 6 a 8 horas despus de tener sexo y debe retirarse en el plazo de las 24 horas. Un diafragma es recetado y colocado por un mdico. Debe reemplazarse cada 1 a 2 aos, despus de dar a luz, de aumentar ms de 15lb (6,8kg) y de una ciruga plvica. Capuchn cervical  Un capuchn cervical es una copa redonda y blanda de ltex o plstico que se coloca en el cuello uterino. Se inserta en la vagina antes del sexo, junto con un espermicida. Bloquea el ingreso del esperma en el tero. El capuchn debe permanecer en el lugar durante 6 a 8 horas despus de tener sexo y debe retirarse en el plazo de las 48 horas. Un capuchn cervical debe ser recetado y colocado por un mdico. Debe reemplazarse cada 2aos. Esponja  Una esponja es una pieza blanda y circular de espuma de poliuretano que contiene espermicida. La esponja ayuda a bloquear el ingreso de esperma en el tero, y el espermicida mata a los espermatozoides. Para utilizarla, debe humedecerla e insertarla en la vagina. Debe insertarse antes de tener sexo, debe permanecer dentro al menos   durante 6 horas despus de tener sexo y debe retirarse y desecharse en el plazo de las 30 horas. Espermicidas Los espermicidas son sustancias qumicas que matan o bloquean al esperma y no lo dejan ingresar al cuello uterino y al tero.  Vienen en forma de crema, gel, supositorio, espuma o comprimido. Un espermicida debe insertarse en la vagina con un aplicador al menos 10 o 15 minutos antes de tener sexo para dar tiempo a que surta efecto. El proceso debe repetirse cada vez que tenga sexo. Los espermicidas no requieren receta mdica. Anticonceptivos intrauterinos Dispositivo intrauterino (DIU). Un DIU es un dispositivo en forma de T que se coloca en el tero. Existen dos tipos:  DIU hormonal.Este tipo contiene progestina, una forma sinttica de la hormona progesterona. Este tipo puede permanecer colocado durante 3 a 5 aos.  DIU de cobre.Este tipo est recubierto con un alambre de cobre. Puede permanecer colocado durante 10 aos.  Mtodos anticonceptivos permanentes Ligadura de trompas en la mujer En este mtodo, se sellan, atan u obstruyen las trompas de Falopio durante una ciruga para evitar que el vulo descienda hacia el tero. Esterilizacin histeroscpica En este mtodo, se coloca un implante pequeo y flexible dentro de cada trompa de Falopio. Los implantes hacen que se forme un tejido cicatricial en las trompas de Falopio y que las obstruya para que el espermatozoide no pueda llegar al vulo. El procedimiento demora alrededor de 3 meses para que sea efectivo. Debe utilizarse otro mtodo anticonceptivo durante esos 3 meses. Esterilizacin masculina Este es un procedimiento que consiste en atar los conductos que transportan el esperma (vasectoma). Luego del procedimiento, el hombre puede eyacular lquido (semen). Mtodos de planificacin natural Planificacin familiar natural En este mtodo, la pareja no tiene sexo durante los das en que la mujer podra quedar embarazada. Mtodo calendario Esto significa realizar un seguimiento de la duracin de cada ciclo menstrual, identificar los das en los que se puede producir un embarazo y no tener sexo durante esos das. Mtodo de la ovulacin En este mtodo, la pareja evita  tener sexo durante la ovulacin. Mtodo sintotrmico Este mtodo implica no tener sexo durante la ovulacin. Normalmente, la mujer comprueba la ovulacin al observar cambios en su temperatura y en la consistencia del moco cervical. Mtodo posovulacin En este mtodo, la pareja espera a que finalice la ovulacin para tener sexo. Resumen  La anticoncepcin, o los mtodos anticonceptivos, hace referencia a los mtodos o dispositivos que evitan el embarazo.  Los mtodos anticonceptivos hormonales incluyen implantes, inyecciones, pastillas, parches, anillos vaginales y anticonceptivos de emergencia.  Los mtodos anticonceptivos de barrera pueden incluir preservativos masculinos, preservativos femeninos, diafragmas, capuchones cervicales, esponjas y espermicidas.  Existen dos tipos de DIU (dispositivo intrauterino). Un DIU puede colocarse en el tero de una mujer para evitar el embarazo durante 3 a 5 aos.  La esterilizacin permanente puede realizarse mediante un procedimiento para hombres, mujeres o ambos.  Los mtodos de planificacin familiar natural incluyen no tener sexo durante los das en que la mujer podra quedar embarazada. Esta informacin no tiene como fin reemplazar el consejo del mdico. Asegrese de hacerle al mdico cualquier pregunta que tenga. Document Revised: 07/28/2017 Document Reviewed: 10/16/2016 Elsevier Patient Education  2020 [employer]   Lactancia materna Breastfeeding  Decidir amamantar es una de las mejores elecciones que puede hacer por usted y su beb. Un cambio en las hormonas durante el embarazo hace que las mamas produzcan leche materna en las glndulas productoras de leche. Las hormonas impiden que la leche   materna sea liberada antes del nacimiento del beb. Adems, impulsan el flujo de leche luego del nacimiento. Una vez que ha comenzado a amamantar, pensar en el beb, as como la succin o el llanto, pueden estimular la liberacin de leche de las  glndulas productoras de leche. Los beneficios de amamantar Las investigaciones demuestran que la lactancia materna ofrece muchos beneficios de salud para bebs y madres. Adems, ofrece una forma gratuita y conveniente de alimentar al beb. Para el beb  La primera leche (calostro) ayuda a mejorar el funcionamiento del aparato digestivo del beb.  Las clulas especiales de la leche (anticuerpos) ayudan a combatir las infecciones en el beb.  Los bebs que se alimentan con leche materna tambin tienen menos probabilidades de tener asma, alergias, obesidad o diabetes de tipo 2. Adems, tienen menor riesgo de sufrir el sndrome de muerte sbita del lactante (SMSL).  Los nutrientes de la leche materna son mejores para satisfacer las necesidades del beb en comparacin con la leche maternizada.  La leche materna mejora el desarrollo cerebral del beb. Para usted  La lactancia materna favorece el desarrollo de un vnculo muy especial entre la madre y el beb.  Es conveniente. La leche materna es econmica y siempre est disponible a la temperatura correcta.  La lactancia materna ayuda a quemar caloras. Le ayuda a perder el peso ganado durante el embarazo.  Hace que el tero vuelva al tamao que tena antes del embarazo ms rpido. Adems, disminuye el sangrado (loquios) despus del parto.  La lactancia materna contribuye a reducir el riesgo de tener diabetes de tipo 2, osteoporosis, artritis reumatoide, enfermedades cardiovasculares y cncer de mama, ovario, tero y endometrio en el futuro. Informacin bsica sobre la lactancia Comienzo de la lactancia  Encuentre un lugar cmodo para sentarse o acostarse, con un buen respaldo para el cuello y la espalda.  Coloque una almohada o una manta enrollada debajo del beb para acomodarlo a la altura de la mama (si est sentada). Las almohadas para amamantar se han diseado especialmente a fin de servir de apoyo para los brazos y el beb mientras  amamanta.  Asegrese de que la barriga del beb (abdomen) est frente a la suya.  Masajee suavemente la mama. Con las yemas de los dedos, masajee los bordes exteriores de la mama hacia adentro, en direccin al pezn. Esto estimula el flujo de leche. Si la leche fluye lentamente, es posible que deba continuar con este movimiento durante la lactancia.  Sostenga la mama con 4 dedos por debajo y el pulgar por arriba del pezn (forme la letra "C" con la mano). Asegrese de que los dedos se encuentren lejos del pezn y de la boca del beb.  Empuje suavemente los labios del beb con el pezn o con el dedo.  Cuando la boca del beb se abra lo suficiente, acrquelo rpidamente a la mama e introduzca todo el pezn y la arola, tanto como sea posible, dentro de la boca del beb. La arola es la zona de color que rodea al pezn. ? Debe haber ms arola visible por arriba del labio superior del beb que por debajo del labio inferior. ? Los labios del beb deben estar abiertos y extendidos hacia afuera (evertidos) para asegurar que el beb se prenda de forma adecuada y cmoda. ? La lengua del beb debe estar entre la enca inferior y la mama.  Asegrese de que la boca del beb est en la posicin correcta alrededor del pezn (prendido). Los labios del beb deben crear   un sello sobre la mama y estar doblados hacia afuera (invertidos).  Es comn que el beb succione durante 2 a 3 minutos para que comience el flujo de leche materna. Cmo debe prenderse Es muy importante que le ensee al beb cmo prenderse adecuadamente a la mama. Si el beb no se prende adecuadamente, puede causar dolor en los pezones, reducir la produccin de leche materna y hacer que el beb tenga un escaso aumento de peso. Adems, si el beb no se prende adecuadamente al pezn, puede tragar aire durante la alimentacin. Esto puede causarle molestias al beb. Hacer eructar al beb al cambiar de mama puede ayudarlo a liberar el aire. Sin  embargo, ensearle al beb cmo prenderse a la mama adecuadamente es la mejor manera de evitar que se sienta molesto por tragar aire mientras se alimenta. Signos de que el beb se ha prendido adecuadamente al pezn  Tironea o succiona de modo silencioso, sin causarle dolor. Los labios del beb deben estar extendidos hacia afuera (evertidos).  Se escucha que traga cada 3 o 4 succiones una vez que la leche ha comenzado a fluir (despus de que se produzca el reflejo de eyeccin de la leche).  Hay movimientos musculares por arriba y por delante de sus odos al succionar. Signos de que el beb no se ha prendido adecuadamente al pezn  Hace ruidos de succin o de chasquido mientras se alimenta.  Siente dolor en los pezones. Si cree que el beb no se prendi correctamente, deslice el dedo en la comisura de la boca y colquelo entre las encas del beb para interrumpir la succin. Intente volver a comenzar a amamantar. Signos de lactancia materna exitosa Signos del beb  El beb disminuir gradualmente el nmero de succiones o dejar de succionar por completo.  El beb se quedar dormido.  El cuerpo del beb se relajar.  El beb retendr una pequea cantidad de leche en la boca.  El beb se desprender solo del pecho. Signos que presenta usted  Las mamas han aumentado la firmeza, el peso y el tamao 1 a 3 horas despus de amamantar.  Estn ms blandas inmediatamente despus de amamantar.  Se producen un aumento del volumen de leche y un cambio en su consistencia y color hacia el quinto da de lactancia.  Los pezones no duelen, no estn agrietados ni sangran. Signos de que su beb recibe la cantidad de leche suficiente  Mojar por lo menos 1 o 2paales durante las primeras 24horas despus del nacimiento.  Mojar por lo menos 5 o 6paales cada 24horas durante la primera semana despus del nacimiento. La orina debe ser clara o de color amarillo plido a los 5das de vida.  Mojar  entre 6 y 8paales cada 24horas a medida que el beb sigue creciendo y desarrollndose.  Defeca por lo menos 3 veces en 24 horas a los 5 das de vida. Las heces deben ser blandas y amarillentas.  Defeca por lo menos 3 veces en 24 horas a los 7 das de vida. Las heces deben ser grumosas y amarillentas.  No registra una prdida de peso mayor al 10% del peso al nacer durante los primeros 3 das de vida.  Aumenta de peso un promedio de 4 a 7onzas (113 a 198g) por semana despus de los 4 das de vida.  Aumenta de peso, diariamente, de manera uniforme a partir de los 5 das de vida, sin registrar prdida de peso despus de las 2semanas de vida. Despus de alimentarse, es posible que   el beb regurgite una pequea cantidad de leche. Esto es normal. Frecuencia y duracin de la lactancia El amamantamiento frecuente la ayudar a producir ms leche y puede prevenir dolores en los pezones y las mamas extremadamente llenas (congestin mamaria). Alimente al beb cuando muestre signos de hambre o si siente la necesidad de reducir la congestin de las mamas. Esto se denomina "lactancia a demanda". Las seales de que el beb tiene hambre incluyen las siguientes:  Aumento del estado de alerta, actividad o inquietud.  Mueve la cabeza de un lado a otro.  Abre la boca cuando se le toca la mejilla o la comisura de la boca (reflejo de bsqueda).  Aumenta las vocalizaciones, tales como sonidos de succin, se relame los labios, emite arrullos, suspiros o chirridos.  Mueve la mano hacia la boca y se chupa los dedos o las manos.  Est molesto o llora. Evite el uso del chupete en las primeras 4 a 6 semanas despus del nacimiento del beb. Despus de este perodo, podr usar un chupete. Las investigaciones demostraron que el uso del chupete durante el primer ao de vida del beb disminuye el riesgo de tener el sndrome de muerte sbita del lactante (SMSL). Permita que el nio se alimente en cada mama todo lo que  desee. Cuando el beb se desprende o se queda dormido mientras se est alimentando de la primera mama, ofrzcale la segunda. Debido a que, con frecuencia, los recin nacidos estn somnolientos las primeras semanas de vida, es posible que deba despertar al beb para alimentarlo. Los horarios de lactancia varan de un beb a otro. Sin embargo, las siguientes reglas pueden servir como gua para ayudarla a garantizar que el beb se alimenta adecuadamente:  Se puede amamantar a los recin nacidos (bebs de 4 semanas o menos de vida) cada 1 a 3 horas.  No deben transcurrir ms de 3 horas durante el da o 5 horas durante la noche sin que se amamante a los recin nacidos.  Debe amamantar al beb un mnimo de 8 veces en un perodo de 24 horas. Extraccin de leche materna     La extraccin y el almacenamiento de la leche materna le permiten asegurarse de que el beb se alimente exclusivamente de su leche materna, aun en momentos en los que no puede amamantar. Esto tiene especial importancia si debe regresar al trabajo en el perodo en que an est amamantando o si no puede estar presente en los momentos en que el beb debe alimentarse. Su asesor en lactancia puede ayudarla a encontrar un mtodo de extraccin que funcione mejor para usted y orientarla sobre cunto tiempo es seguro almacenar leche materna. Cmo cuidar las mamas durante la lactancia Los pezones pueden secarse, agrietarse y doler durante la lactancia. Las siguientes recomendaciones pueden ayudarla a mantener las mamas humectadas y sanas:  Evite usar jabn en los pezones.  Use un sostn de soporte diseado especialmente para la lactancia materna. Evite usar sostenes con aro o sostenes muy ajustados (sostenes deportivos).  Seque al aire sus pezones durante 3 a 4minutos despus de amamantar al beb.  Utilice solo apsitos de algodn en el sostn para absorber las prdidas de leche. La prdida de un poco de leche materna entre las tomas es  normal.  Utilice lanolina sobre los pezones luego de amamantar. La lanolina ayuda a mantener la humedad normal de la piel. La lanolina pura no es perjudicial (no es txica) para el beb. Adems, puede extraer manualmente algunas gotas de leche materna y masajear   suavemente esa leche sobre los pezones para que la leche se seque al aire. Durante las primeras semanas despus del nacimiento, algunas mujeres experimentan congestin mamaria. La congestin mamaria puede hacer que sienta las mamas pesadas, calientes y sensibles al tacto. El pico de la congestin mamaria ocurre en el plazo de los 3 a 5 das despus del parto. Las siguientes recomendaciones pueden ayudarla a aliviar la congestin mamaria:  Vace por completo las mamas al amamantar o extraer leche. Puede aplicar calor hmedo en las mamas (en la ducha o con toallas hmedas para manos) antes de amamantar o extraer leche. Esto aumenta la circulacin y ayuda a que la leche fluya. Si el beb no vaca por completo las mamas cuando lo amamanta, extraiga la leche restante despus de que haya finalizado.  Aplique compresas de hielo sobre las mamas inmediatamente despus de amamantar o extraer leche, a menos que le resulte demasiado incmodo. Haga lo siguiente: ? Ponga el hielo en una bolsa plstica. ? Coloque una toalla entre la piel y la bolsa de hielo. ? Coloque el hielo durante 20minutos, 2 o 3veces por da.  Asegrese de que el beb est prendido y se encuentre en la posicin correcta mientras lo alimenta. Si la congestin mamaria persiste luego de 48 horas o despus de seguir estas recomendaciones, comunquese con su mdico o un asesor en lactancia. Recomendaciones de salud general durante la lactancia  Consuma 3 comidas y 3 colaciones saludables todos los das. Las madres bien alimentadas que amamantan necesitan entre 450 y 500 caloras adicionales por da. Puede cumplir con este requisito al aumentar la cantidad de una dieta equilibrada que  realice.  Beba suficiente agua para mantener la orina clara o de color amarillo plido.  Descanse con frecuencia, reljese y siga tomando sus vitaminas prenatales para prevenir la fatiga, el estrs y los niveles bajos de vitaminas y minerales en el cuerpo (deficiencias de nutrientes).  No consuma ningn producto que contenga nicotina o tabaco, como cigarrillos y cigarrillos electrnicos. El beb puede verse afectado por las sustancias qumicas de los cigarrillos que pasan a la leche materna y por la exposicin al humo ambiental del tabaco. Si necesita ayuda para dejar de fumar, consulte al mdico.  Evite el consumo de alcohol.  No consuma drogas ilegales o marihuana.  Antes de usar cualquier medicamento, hable con el mdico. Estos incluyen medicamentos recetados y de venta libre, como tambin vitaminas y suplementos a base de hierbas. Algunos medicamentos, que pueden ser perjudiciales para el beb, pueden pasar a travs de la leche materna.  Puede quedar embarazada durante la lactancia. Si se desea un mtodo anticonceptivo, consulte al mdico sobre cules son las opciones seguras durante la lactancia. Dnde encontrar ms informacin: Liga internacional [employer]eche: [email or web address]. Comunquese con un mdico si:  Siente que quiere dejar de amamantar o se siente frustrada con la lactancia.  Sus pezones estn agrietados o sangran.  Sus mamas estn irritadas, sensibles o calientes.  Tiene los siguientes sntomas: ? Dolor en las mamas o en los pezones. ? Un rea hinchada en cualquiera de las mamas. ? Fiebre o escalofros. ? Nuseas o vmitos. ? Drenaje de otro lquido distinto de la leche materna desde los pezones.  Sus mamas no se llenan antes de amamantar al beb para el quinto da despus del parto.  Se siente triste y deprimida.  El beb: ? Est demasiado somnoliento como para comer bien. ? Tiene problemas para dormir. ? Tiene ms de 1 semana de vida y moja menos de   6 paales en un  periodo de 24 horas. ? No ha aumentado de peso a los 5 das de vida.  El beb defeca menos de 3 veces en 24 horas.  La piel del beb o las partes blancas de los ojos se vuelven amarillentas. Solicite ayuda de inmediato si:  El beb est muy cansado (letargo) y no se quiere despertar para comer.  Le sube la fiebre sin causa. Resumen  La lactancia materna ofrece muchos beneficios de salud para bebs y madres.  Intente amamantar a su beb cuando muestre signos tempranos de hambre.  Haga cosquillas o empuje suavemente los labios del beb con el dedo o el pezn para lograr que el beb abra la boca. Acerque el beb a la mama. Asegrese de que la mayor parte de la arola se encuentre dentro de la boca del beb. Ofrzcale una mama y haga eructar al beb antes de pasar a la otra.  Hable con su mdico o asesor en lactancia si tiene dudas o problemas con la lactancia. Esta informacin no tiene como fin reemplazar el consejo del mdico. Asegrese de hacerle al mdico cualquier pregunta que tenga. Document Revised: 09/20/2017 Document Reviewed: 10/16/2016 Elsevier Patient Education  2020 [employer]  

## 2020-06-30 NOTE — Progress Notes (Signed)
   Subjective:  Kimberly Fox is a 24 y.o. G2P1001 at [redacted]w[redacted]d being seen today for ongoing prenatal care.  She is currently monitored for the following issues for this high-risk pregnancy and has History of laparoscopic cholecystectomy; Chronic calculous cholecystitis; Supervision of low-risk pregnancy; DM (diabetes mellitus), gestational; and Group B streptococcal infection during pregnancy on their problem list.  Patient reports no complaints.  Contractions: Irregular. Vag. Bleeding: None.  Movement: Present. Denies leaking of fluid.   The following portions of the patient's history were reviewed and updated as appropriate: allergies, current medications, past family history, past medical history, past social history, past surgical history and problem list. Problem list updated.  Objective:   Vitals:   06/30/20 1330  BP: (!) 116/59  Pulse: (!) 103  Weight: 160 lb 6.4 oz (72.8 kg)    Fetal Status: Fetal Heart Rate (bpm): NST   Movement: Present  Presentation: Vertex  General:  Alert, oriented and cooperative. Patient is in no acute distress.  Skin: Skin is warm and dry. No rash noted.   Cardiovascular: Normal heart rate noted  Respiratory: Normal respiratory effort, no problems with respiration noted  Abdomen: Soft, gravid, appropriate for gestational age. Pain/Pressure: Present     Pelvic: Vag. Bleeding: None     Cervical exam performed Dilation: 2.5 Effacement (%): Thick Station: -3  Extremities: Normal range of motion.  Edema: None  Mental Status: Normal mood and affect. Normal behavior. Normal judgment and thought content.   Urinalysis:      Assessment and Plan:  Pregnancy: G2P1001 at [redacted]w[redacted]d  1. Gestational diabetes mellitus (GDM) in third trimester controlled on oral hypoglycemic drug Well controlled sugars today BPP 10/10 today Last growth [place] 42%, EFW 2860g  2. Supervision of high risk pregnancy in third trimester IOL scheduled for 12/27 BP and FHR  normal Membrane sweep performed per patient request  3. Encounter for supervision of low-risk pregnancy in third trimester   4. Group B streptococcal infection during pregnancy   Term labor symptoms and general obstetric precautions including but not limited to vaginal bleeding, contractions, leaking of fluid and fetal movement were reviewed in detail with the patient. Please refer to After Visit Summary for other counseling recommendations.  Return in 6 weeks (on 08/11/2020) for PP check.   Venora Maples, MD

## 2020-06-30 NOTE — Progress Notes (Signed)
IOL scheduled on 12/27

## 2020-07-04 ENCOUNTER — Encounter (HOSPITAL_COMMUNITY): Payer: Self-pay | Admitting: Obstetrics and Gynecology

## 2020-07-04 ENCOUNTER — Other Ambulatory Visit: Payer: Self-pay

## 2020-07-04 ENCOUNTER — Inpatient Hospital Stay (HOSPITAL_COMMUNITY)
Admission: AD | Admit: 2020-07-04 | Discharge: 2020-07-05 | DRG: 806 | Disposition: A | Discharge status: Home / Self Care | Payer: Medicaid Other | Attending: Obstetrics and Gynecology | Admitting: Obstetrics and Gynecology

## 2020-07-04 DIAGNOSIS — O24425 Gestational diabetes mellitus in childbirth, controlled by oral hypoglycemic drugs: Principal | ICD-10-CM | POA: Diagnosis present

## 2020-07-04 DIAGNOSIS — Z3A39 39 weeks gestation of pregnancy: Secondary | ICD-10-CM

## 2020-07-04 DIAGNOSIS — O26893 Other specified pregnancy related conditions, third trimester: Secondary | ICD-10-CM | POA: Diagnosis present

## 2020-07-04 DIAGNOSIS — K8010 Calculus of gallbladder with chronic cholecystitis without obstruction: Secondary | ICD-10-CM | POA: Diagnosis present

## 2020-07-04 DIAGNOSIS — O24419 Gestational diabetes mellitus in pregnancy, unspecified control: Secondary | ICD-10-CM | POA: Diagnosis present

## 2020-07-04 DIAGNOSIS — Z8632 Personal history of gestational diabetes: Secondary | ICD-10-CM

## 2020-07-04 DIAGNOSIS — Z3490 Encounter for supervision of normal pregnancy, unspecified, unspecified trimester: Secondary | ICD-10-CM

## 2020-07-04 DIAGNOSIS — Z20822 Contact with and (suspected) exposure to covid-19: Secondary | ICD-10-CM | POA: Diagnosis present

## 2020-07-04 DIAGNOSIS — B951 Streptococcus, group B, as the cause of diseases classified elsewhere: Secondary | ICD-10-CM | POA: Diagnosis present

## 2020-07-04 DIAGNOSIS — O4202 Full-term premature rupture of membranes, onset of labor within 24 hours of rupture: Secondary | ICD-10-CM

## 2020-07-04 DIAGNOSIS — Z3493 Encounter for supervision of normal pregnancy, unspecified, third trimester: Secondary | ICD-10-CM

## 2020-07-04 DIAGNOSIS — O99824 Streptococcus B carrier state complicating childbirth: Secondary | ICD-10-CM | POA: Diagnosis present

## 2020-07-04 DIAGNOSIS — O2662 Liver and biliary tract disorders in childbirth: Secondary | ICD-10-CM | POA: Diagnosis present

## 2020-07-04 DIAGNOSIS — Z9049 Acquired absence of other specified parts of digestive tract: Secondary | ICD-10-CM

## 2020-07-04 LAB — RPR: RPR Ser Ql: NONREACTIVE

## 2020-07-04 LAB — RESP PANEL BY RT-PCR (FLU A&B, COVID) ARPGX2
Influenza A by PCR: NEGATIVE
Influenza B by PCR: NEGATIVE
SARS Coronavirus 2 by RT PCR: NEGATIVE

## 2020-07-04 LAB — CBC
HCT: 38.6 % (ref 36.0–46.0)
Hemoglobin: 12.2 g/dL (ref 12.0–15.0)
MCH: 25.1 pg — ABNORMAL LOW (ref 26.0–34.0)
MCHC: 31.6 g/dL (ref 30.0–36.0)
MCV: 79.4 fL — ABNORMAL LOW (ref 80.0–100.0)
Platelets: 209 10*3/uL (ref 150–400)
RBC: 4.86 MIL/uL (ref 3.87–5.11)
RDW: 14.4 % (ref 11.5–15.5)
WBC: 9.5 10*3/uL (ref 4.0–10.5)
nRBC: 0 % (ref 0.0–0.2)

## 2020-07-04 LAB — TYPE AND SCREEN
ABO/RH(D): B POS
Antibody Screen: NEGATIVE

## 2020-07-04 LAB — GLUCOSE, CAPILLARY
Glucose-Capillary: 94 mg/dL (ref 70–99)
Glucose-Capillary: 90 mg/dL (ref 70–99)

## 2020-07-04 MED ORDER — ONDANSETRON HCL 4 MG/2ML IJ SOLN
4.0000 mg | INTRAMUSCULAR | Status: DC | PRN
Start: 2020-07-04 — End: 2020-07-05

## 2020-07-04 MED ORDER — PRENATAL MULTIVITAMIN CH
1.0000 | ORAL_TABLET | Freq: Every day | ORAL | Status: DC
  Administered 2020-07-05: 12:00:00 1 via ORAL
  Filled 2020-07-04: qty 1

## 2020-07-04 MED ORDER — FENTANYL-BUPIVACAINE-NACL 0.5-0.125-0.9 MG/250ML-% EP SOLN: 12.0000 mL/h | EPIDURAL | Status: DC | PRN

## 2020-07-04 MED ORDER — LIDOCAINE HCL (PF) 1 % IJ SOLN
30.0000 mL | INTRAMUSCULAR | Status: DC | PRN
  Filled 2020-07-04: qty 30

## 2020-07-04 MED ORDER — SODIUM CHLORIDE 0.9 % IV SOLN: 1.0000 g | Freq: Four times a day (QID) | INTRAVENOUS | Status: DC

## 2020-07-04 MED ORDER — LACTATED RINGERS IV SOLN: 500.0000 mL | INTRAVENOUS | Status: DC | PRN

## 2020-07-04 MED ORDER — PHENYLEPHRINE 40 MCG/ML (10ML) SYRINGE FOR IV PUSH (FOR BLOOD PRESSURE SUPPORT): 80.0000 ug | PREFILLED_SYRINGE | INTRAVENOUS | Status: DC | PRN

## 2020-07-04 MED ORDER — IBUPROFEN 600 MG PO TABS
600.0000 mg | ORAL_TABLET | Freq: Four times a day (QID) | ORAL | Status: DC
  Administered 2020-07-04 – 2020-07-05 (×5): 600 mg via ORAL
  Filled 2020-07-04 (×5): qty 1

## 2020-07-04 MED ORDER — ACETAMINOPHEN 325 MG PO TABS
650.0000 mg | ORAL_TABLET | Freq: Four times a day (QID) | ORAL | Status: DC
  Administered 2020-07-04: 21:00:00 650 mg via ORAL
  Filled 2020-07-04 (×2): qty 2

## 2020-07-04 MED ORDER — EPHEDRINE 5 MG/ML INJ: 10.0000 mg | INTRAVENOUS | Status: DC | PRN

## 2020-07-04 MED ORDER — SENNOSIDES-DOCUSATE SODIUM 8.6-50 MG PO TABS
2.0000 | ORAL_TABLET | Freq: Every day | ORAL | Status: DC
  Administered 2020-07-05: 10:00:00 2 via ORAL
  Filled 2020-07-04: qty 2

## 2020-07-04 MED ORDER — TETANUS-DIPHTH-ACELL PERTUSSIS 5-2.5-18.5 LF-MCG/0.5 IM SUSY: 0.5000 mL | PREFILLED_SYRINGE | Freq: Once | INTRAMUSCULAR | Status: DC

## 2020-07-04 MED ORDER — SODIUM CHLORIDE 0.9 % IV SOLN
1.0000 g | INTRAVENOUS | Status: DC
  Administered 2020-07-04: 09:00:00 1 g via INTRAVENOUS
  Filled 2020-07-04: qty 1000

## 2020-07-04 MED ORDER — WITCH HAZEL-GLYCERIN EX PADS: 1.0000 "application " | MEDICATED_PAD | CUTANEOUS | Status: DC | PRN

## 2020-07-04 MED ORDER — DIPHENHYDRAMINE HCL 25 MG PO CAPS: 25.0000 mg | ORAL_CAPSULE | Freq: Four times a day (QID) | ORAL | Status: DC | PRN

## 2020-07-04 MED ORDER — ONDANSETRON HCL 4 MG PO TABS: 4.0000 mg | ORAL_TABLET | ORAL | Status: DC | PRN

## 2020-07-04 MED ORDER — ONDANSETRON HCL 4 MG/2ML IJ SOLN
4.0000 mg | Freq: Four times a day (QID) | INTRAMUSCULAR | Status: DC | PRN
  Administered 2020-07-04: 08:00:00 4 mg via INTRAVENOUS
  Filled 2020-07-04: qty 2

## 2020-07-04 MED ORDER — SOD CITRATE-CITRIC ACID 500-334 MG/5ML PO SOLN: 30.0000 mL | ORAL | Status: DC | PRN

## 2020-07-04 MED ORDER — COCONUT OIL OIL
1.0000 "application " | TOPICAL_OIL | Status: DC | PRN
  Administered 2020-07-04: 1 via TOPICAL

## 2020-07-04 MED ORDER — DIPHENHYDRAMINE HCL 50 MG/ML IJ SOLN: 12.5000 mg | INTRAMUSCULAR | Status: DC | PRN

## 2020-07-04 MED ORDER — OXYTOCIN BOLUS FROM INFUSION
333.0000 mL | Freq: Once | INTRAVENOUS | Status: AC
  Administered 2020-07-04: 09:00:00 333 mL via INTRAVENOUS

## 2020-07-04 MED ORDER — BENZOCAINE-MENTHOL 20-0.5 % EX AERO
1.0000 | INHALATION_SPRAY | CUTANEOUS | Status: DC | PRN
Start: 2020-07-04 — End: 2020-07-05

## 2020-07-04 MED ORDER — LACTATED RINGERS IV SOLN: 500.0000 mL | Freq: Once | INTRAVENOUS | Status: DC

## 2020-07-04 MED ORDER — SODIUM CHLORIDE 0.9 % IV SOLN
2.0000 g | Freq: Once | INTRAVENOUS | Status: AC
  Administered 2020-07-04: 05:00:00 2 g via INTRAVENOUS
  Filled 2020-07-04: qty 2000

## 2020-07-04 MED ORDER — ACETAMINOPHEN 325 MG PO TABS: 650.0000 mg | ORAL_TABLET | ORAL | Status: DC | PRN

## 2020-07-04 MED ORDER — LACTATED RINGERS IV SOLN: INTRAVENOUS | Status: DC

## 2020-07-04 MED ORDER — FENTANYL CITRATE (PF) 100 MCG/2ML IJ SOLN: 50.0000 ug | INTRAMUSCULAR | Status: DC | PRN

## 2020-07-04 MED ORDER — OXYTOCIN-SODIUM CHLORIDE 30-0.9 UT/500ML-% IV SOLN
2.5000 [IU]/h | INTRAVENOUS | Status: DC
  Filled 2020-07-04: qty 500

## 2020-07-04 MED ORDER — DIBUCAINE (PERIANAL) 1 % EX OINT: 1.0000 "application " | TOPICAL_OINTMENT | CUTANEOUS | Status: DC | PRN

## 2020-07-04 MED ORDER — SIMETHICONE 80 MG PO CHEW: 80.0000 mg | CHEWABLE_TABLET | ORAL | Status: DC | PRN

## 2020-07-04 NOTE — MAU Note (Signed)
Patient reports to MAU complaining of contractions every 5 minutes since this afternoon. Reports +FM. Denies vaginal bleeding or leaking of fluid.

## 2020-07-04 NOTE — Lactation Note (Signed)
This note was copied from a baby's chart. Lactation Consultation Note  Patient Name: Kimberly Fox [id number] Date: 07/04/2020 Reason for consult: Initial assessment Age:24 hours  Initial visit to 5 hours old infant of a P2 mother with breastfeeding experience. Mother states infant latch after delivery and able to be skin to skin for ~60 minutes. Infant started cueing. Set up support pillows for cross cradle position to left breast. Infant holds nipple in mouth but does not suck. Talked to mother about hand expression and demonstrated technique. Colostrum easily expressed and collected ~4mL in a spoon.   Reviewed with mother average size of a NB stomach. Talked about infant's hunger and fullness cues.  Discussed milk coming to volume. Reviewed newborn behavior and expectations during first days of life.    Plan: 1-Breastfeeding on demand, ensuring a deep, comfortable latch.  2-Offer breast 8-12 times in 24h period to establish good milk supply. 3-Undressing infant and place skin to skin when ready to breastfeed 4-Keep infant awake during breastfeeding session: massaging breast, infant's hand/shoulder/feet 5-Monitor voids and stools as signs good intake.  6-Encouraged maternal rest, hydration and food intake.  7-Contact LC as needed for feeds/support/concerns/questions   All questions answered at this time. Provided Lactation services brochure and promoted [employer] booklet information.   Maternal Data Formula Feeding for Exclusion: No Has patient been taught Hand Expression?: Yes Does the patient have breastfeeding experience prior to this delivery?: Yes  Feeding Feeding Type: Breast Fed  LATCH Score Latch: Repeated attempts needed to sustain latch, nipple held in mouth throughout feeding, stimulation needed to elicit sucking reflex.  Audible Swallowing: A few with stimulation  Type of Nipple: Everted at rest and after stimulation  Comfort (Breast/Nipple): Soft /  non-tender  Hold (Positioning): Assistance needed to correctly position infant at breast and maintain latch.  LATCH Score: 7  Interventions Interventions: Breast feeding basics reviewed;Assisted with latch;Skin to skin;Breast massage;Hand express;Adjust position;Position options;Support pillows;Expressed milk;Hand pump  Lactation Tools Discussed/Used WIC Program: Yes Initiated by:: mother's preference   Consult Status Consult Status: Follow-up Date: 07/05/20 Follow-up type: In-patient    Attilio Zeitler A Higuera Ancidey 07/04/2020, 3:15 PM

## 2020-07-04 NOTE — Lactation Note (Addendum)
This note was copied from a baby's chart. Lactation Consultation Note  Patient Name: Kimberly Fox [id number] Date: 07/04/2020 Reason for consult: L&D Initial assessment Age:24 hours  P2, Breastfed first child for a few months.  Had latching difficulty.  RN assisting with latching upon entering.  LC assisted using teacup hold.  Infant tongue thrusting, intermittent latching with sucking bursts.  Reviewed with FOB how to assist with latching.  Encouraged STS.    Feeding Feeding Type: Breast Fed  LATCH Score Latch: Grasps breast easily, tongue down, lips flanged, rhythmical sucking.  Audible Swallowing: A few with stimulation  Type of Nipple: Everted at rest and after stimulation  Comfort (Breast/Nipple): Soft / non-tender  Hold (Positioning): Assistance needed to correctly position infant at breast and maintain latch.  LATCH Score: 8  Interventions Interventions: Breast feeding basics reviewed;Assisted with latch;Skin to skin  Consult Status Consult Status: Follow-up Date: 07/04/20 Follow-up type: In-patient    [name] [facility] 07/04/2020, 10:27 AM

## 2020-07-04 NOTE — Discharge Summary (Signed)
Postpartum Discharge Summary  Date of Service updated 07/05/20     Patient Name: Kimberly Fox DOB: 25-Feb-1996 MRN: [id number]  Date of admission: 07/04/2020 Delivery date:07/04/2020  Delivering provider: Randa Ngo  Date of discharge: 07/05/2020  Admitting diagnosis: GDM, class A2 [O24.419] Intrauterine pregnancy: [redacted]w[redacted]d    Secondary diagnosis:  Principal Problem:   Vaginal delivery Active Problems:   History of laparoscopic cholecystectomy   Chronic calculous cholecystitis   Supervision of low-risk pregnancy   DM (diabetes mellitus), gestational   Group B streptococcal infection during pregnancy   GDM, class A2  Additional problems: as noted above   Discharge diagnosis: Vaginal delivery                                           Post partum procedures:none Augmentation: AROM Complications: None  Hospital course: Onset of Labor With Vaginal Delivery      24y.o. yo G2P1001 at 24w1das admitted in Latent Labor on 07/04/2020. Patient had an uncomplicated labor course as follows:  Membrane Rupture Time/Date: 8:56 AM ,07/04/2020   Delivery Method:Vaginal, Spontaneous  Episiotomy: None  Lacerations:  None  Patient had an uncomplicated postpartum course.  She is ambulating, tolerating a regular diet, passing flatus, and urinating well. Patient is discharged home in stable condition on 07/05/20.  Newborn Data: Birth date:07/04/2020  Birth time:9:26 AM  Gender:Female  Living status:Living  Apgars:8 ,9  Weight:3379 g   Magnesium Sulfate received: No BMZ received: No Rhophylac:N/A MMR:N/A T-DaP:Given prenatally Flu: Yes Transfusion:No  Physical exam  Vitals:   07/04/20 1630 07/04/20 2049 07/05/20 0010 07/05/20 0525  BP: (!) 108/57 107/65 107/65 114/63  Pulse: 89 79 (!) 58 (!) 52  Resp: '17 16 18 17  ' Temp: 98 F (36.7 C) 98.4 F (36.9 C) 98.4 F (36.9 C) 97.9 F (36.6 C)  TempSrc: Oral Oral Oral Oral  SpO2:  100%     General: alert,  cooperative and no distress Lochia: appropriate Uterine Fundus: firm Incision: N/A DVT Evaluation: No evidence of DVT seen on physical exam. Labs: Lab Results  Component Value Date   WBC 9.5 07/04/2020   HGB 12.2 07/04/2020   HCT 38.6 07/04/2020   MCV 79.4 (L) 07/04/2020   PLT 209 07/04/2020   CMP Latest Ref Rng & Units 02/04/2020  Glucose 70 - 99 mg/dL 81  BUN 6 - 20 mg/dL 6  Creatinine 0.44 - 1.00 mg/dL 0.43(L)  Sodium 135 - 145 mmol/L 137  Potassium 3.5 - 5.1 mmol/L 3.7  Chloride 98 - 111 mmol/L 105  CO2 22 - 32 mmol/L 22  Calcium 8.9 - 10.3 mg/dL 9.2  Total Protein 6.5 - 8.1 g/dL 6.5  Total Bilirubin 0.3 - 1.2 mg/dL 0.3  Alkaline Phos 38 - 126 U/L 65  AST 15 - 41 U/L 14(L)  ALT 0 - 44 U/L 13   Edinburgh Score: Edinburgh Postnatal Depression Scale Screening Tool 02/10/2019  I have been able to laugh and see the funny side of things. 0  I have looked forward with enjoyment to things. 0  I have blamed myself unnecessarily when things went wrong. 0  I have been anxious or worried for no good reason. 0  I have felt scared or panicky for no good reason. 0  Things have been getting on top of me. 0  I have been so unhappy that  I have had difficulty sleeping. 0  I have felt sad or miserable. 0  I have been so unhappy that I have been crying. 0  The thought of harming myself has occurred to me. 0  Edinburgh Postnatal Depression Scale Total 0     After visit meds:  Allergies as of 07/05/2020   No Known Allergies     Medication List    STOP taking these medications   Comfort Fit Maternity Supp Med Misc   metFORMIN 500 MG tablet Commonly known as: GLUCOPHAGE     TAKE these medications   acetaminophen 500 MG tablet Commonly known as: TYLENOL Take 2 tablets (1,000 mg total) by mouth every 6 (six) hours as needed.   coconut oil Oil Apply 1 application topically as needed.   ibuprofen 600 MG tablet Commonly known as: ADVIL Take 1 tablet (600 mg total) by mouth  every 6 (six) hours.   prenatal vitamin w/FE, FA 27-1 MG Tabs tablet Take 1 tablet by mouth daily at 12 noon.        Discharge home in stable condition Infant Feeding: Breast Infant Disposition:home with mother Discharge instruction: per After Visit Summary and Postpartum booklet. Activity: Advance as tolerated. Pelvic rest for 6 weeks.  Diet: routine diet Future Appointments:No future appointments. Follow up Visit:  [place] [facility] [place] [facility] Follow up in 6 week(s).   Specialty: Obstetrics and Gynecology Contact information: [place] [address] [phone]             Message sent to [facility] clinic on 07/04/20 to schedule PP appt.  Please schedule this patient for a In person postpartum visit in 6 weeks with the following provider: Any provider. Additional Postpartum F/U:2 hour GTT in 6-8 weeks High risk pregnancy complicated by: [id number] (metformin) Delivery mode:  Vaginal, Spontaneous  Anticipated Birth Control:  plan for LARC at HD   35/52/1747 Arrie Senate, MD

## 2020-07-04 NOTE — Discharge Instructions (Signed)

## 2020-07-04 NOTE — H&P (Addendum)
OBSTETRIC ADMISSION HISTORY AND PHYSICAL  Kimberly Fox is a 24 y.o. female G2P1001 with IUP at [redacted]w[redacted]d by 12 wk u/s presenting for SOL. She reports +FMs, No LOF, no VB, no blurry vision, headaches or peripheral edema, and RUQ pain.  She plans on breast feeding. She will obtain birth control @[facility]  postpartum. She received her prenatal care at [facility]   Dating: By early u/s --->  Estimated Date of Delivery: 07/10/20  Sono:    06/16/20@[redacted]w[redacted]d , CWD, normal anatomy, cephalic presentation, 2860g, 09[birth year]04-1997 EFW   Prenatal History/Complications:  GBS positive A2GDM (metformin)  Past Medical History: Past Medical History:  Diagnosis Date  . Group B streptococcal infection during pregnancy 02/10/2019  . Medical history non-contributory   . Vaginal delivery 02/12/2019    Past Surgical History: Past Surgical History:  Procedure Laterality Date  . CHOLECYSTECTOMY N/A 03/03/2019   Procedure: LAPAROSCOPIC CHOLECYSTECTOMY;  Surgeon: 03/05/2019, MD;  Location: [facility] OR;  Service: General;  Laterality: N/A;  . WISDOM TOOTH EXTRACTION      Obstetrical History: OB History    Gravida  2   Para  1   Term  1   Preterm      AB      Living  1     SAB      IAB      Ectopic      Multiple  0   Live Births  1           Social History Social History   Socioeconomic History  . Marital status: Married    Spouse name: Not on file  . Number of children: Not on file  . Years of education: Not on file  . Highest education level: Not on file  Occupational History  . Not on file  Tobacco Use  . Smoking status: Never Smoker  . Smokeless tobacco: Never Used  Vaping Use  . Vaping Use: Never used  Substance and Sexual Activity  . Alcohol use: No  . Drug use: No  . Sexual activity: Yes    Comment: last sex about 2 weeks ago  Other Topics Concern  . Not on file  Social History Narrative  . Not on file   Social Determinants of Health   Financial Resource Strain: Not on file   Food Insecurity: No Food Insecurity  . Worried About Berna Bue in the Last Year: Never true  . Ran Out of Food in the Last Year: Never true  Transportation Needs: No Transportation Needs  . Lack of Transportation (Medical): No  . Lack of Transportation (Non-Medical): No  Physical Activity: Not on file  Stress: Not on file  Social Connections: Not on file    Family History: Family History  Problem Relation Age of Onset  . Stroke Father   . Hyperlipidemia Father     Allergies: No Known Allergies  Medications Prior to Admission  Medication Sig Dispense Refill Last Dose  . metFORMIN (GLUCOPHAGE) 500 MG tablet Take 1 tablet (500 mg total) by mouth 2 (two) times daily with a meal. 60 tablet 1 07/03/2020 at Unknown time  . prenatal vitamin w/FE, FA (PRENATAL 1 + 1) 27-1 MG TABS tablet Take 1 tablet by mouth daily at 12 noon.   07/03/2020 at Unknown time  . Elastic Bandages & Supports (COMFORT FIT MATERNITY SUPP MED) MISC 1 Device by Does not apply route daily. 1 each 0      Review of Systems   All systems  reviewed and negative except as stated in HPI  Blood pressure 129/69, pulse 79, temperature 98.1 F (36.7 C), temperature source Oral, resp. rate 18, unknown if currently breastfeeding. General appearance: alert, cooperative and mild distress Lungs: normal respiratory effort Heart: regular rate and rhythm Abdomen: soft, non-tender; gravid Pelvic: as noted below Extremities: Homans sign is negative, no sign of DVT Presentation: cephalic per RN exam in MAU Fetal monitoringBaseline: 140 bpm, Variability: Good {> 6 bpm), Accelerations: Reactive and Decelerations: Absent Uterine activityFrequency: Every 3-5 minutes Dilation: 5.5 Effacement (%): 80 Station: -3 Exam by:: Victorino Dike , RN   Prenatal labs: ABO, Rh: B/Positive/-- (08/10 1200) Antibody: Negative (08/10 1200) Rubella: 1.45 (08/10 1200) RPR: Non Reactive (10/27 0832)  HBsAg: Negative (08/10 1200)   HIV: Non Reactive (10/27 0174)  GBS: Positive/-- (12/08 1525)  2 hr Glucola failed, A2GDM Genetic screening  normal Anatomy US normla  Prenatal Transfer Tool  Maternal Diabetes: Yes:  Diabetes Type:  Insulin/Medication controlled Genetic Screening: Normal Maternal Ultrasounds/Referrals: Normal Fetal Ultrasounds or other Referrals:  None Maternal Substance Abuse:  No Significant Maternal Medications:  None Significant Maternal Lab Results: Group B Strep positive  No results found for this or any previous visit (from the past 24 hour(s)).  Patient Active Problem List   Diagnosis Date Noted  . GDM, class A2 07/04/2020  . Group B streptococcal infection during pregnancy 06/21/2020  . DM (diabetes mellitus), gestational 05/10/2020  . Supervision of low-risk pregnancy 02/17/2020  . History of laparoscopic cholecystectomy 03/03/2019  . Chronic calculous cholecystitis 03/03/2019    Assessment/Plan:  Kimberly Fox is a 24 y.o. G2P1001 at [redacted]w[redacted]d here for SOL.  #Labor: Continue expectant management. #Pain: PRN #FWB:  cat 1 #ID: GBS pos, ampicillin ordered #MOF: breast #MOC:@[facility]  #Circ: no  #A2GDM: on metformin. q2 BGL checks.  Alric Seton, MD  07/04/2020, 4:34 AM

## 2020-07-05 ENCOUNTER — Inpatient Hospital Stay (HOSPITAL_COMMUNITY): Admission: AD | Admit: 2020-07-05 | Payer: Self-pay | Source: Home / Self Care | Admitting: Obstetrics & Gynecology

## 2020-07-05 ENCOUNTER — Inpatient Hospital Stay (HOSPITAL_COMMUNITY): Payer: Self-pay

## 2020-07-05 DIAGNOSIS — O99825 Streptococcus B carrier state complicating the puerperium: Secondary | ICD-10-CM

## 2020-07-05 DIAGNOSIS — O24439 Gestational diabetes mellitus in the puerperium, unspecified control: Secondary | ICD-10-CM

## 2020-07-05 LAB — GLUCOSE, CAPILLARY: Glucose-Capillary: 81 mg/dL (ref 70–99)

## 2020-07-05 MED ORDER — IBUPROFEN 600 MG PO TABS: 600.0000 mg | ORAL_TABLET | Freq: Four times a day (QID) | ORAL | 0 refills | Status: DC

## 2020-07-05 MED ORDER — COCONUT OIL OIL: 1.0000 "application " | TOPICAL_OIL | 0 refills | Status: DC | PRN

## 2020-07-05 MED ORDER — ACETAMINOPHEN 500 MG PO TABS: 1000.0000 mg | ORAL_TABLET | Freq: Four times a day (QID) | ORAL | Status: DC | PRN

## 2020-07-05 NOTE — Progress Notes (Signed)
CSW consulted verbally by RN due to MOB requesting Medicaid Application. CSW spoke with Nita in Financial Counseling and was advised that she would be screening MOB and infant today for Medicaid. CSW updated RN of this. Per Nita, she will be calling into MOB's room son to screen her for Medicaid.    Kaelynne Christley S. Phenix Grein, MSW, LCSW [facility] [phone]   

## 2020-07-05 NOTE — Lactation Note (Signed)
This note was copied from a baby's chart. Lactation Consultation Note  Patient Name: Kimberly Fox [id number] Date: 07/05/2020 Reason for consult: Follow-up assessment Age:24 hours  P2, Baby latched upon entering.  Demonstrated how to compress breast to achieve a deeper latch. Noted infant's nose is stuffy so he comes off and on breast. Reviewed engorgement care and monitoring voids/stools. Feed on demand with cues.  Goal 8-12+ times per day after first 24 hrs.  Place baby STS if not cueing.      Feeding Feeding Type: Breast Fed  LATCH Score Latch: Repeated attempts needed to sustain latch, nipple held in mouth throughout feeding, stimulation needed to elicit sucking reflex.  Audible Swallowing: Spontaneous and intermittent  Type of Nipple: Everted at rest and after stimulation  Comfort (Breast/Nipple): Filling, red/small blisters or bruises, mild/mod discomfort  Hold (Positioning): Assistance needed to correctly position infant at breast and maintain latch.  LATCH Score: 7  Interventions Interventions: Breast feeding basics reviewed;Assisted with latch;Skin to skin  Lactation Tools Discussed/Used     Consult Status Consult Status: Complete Date: 07/05/20    [name] [facility] 07/05/2020, 9:55 AM

## 2020-07-06 ENCOUNTER — Encounter: Payer: Self-pay | Admitting: Family Medicine

## 2020-08-11 ENCOUNTER — Other Ambulatory Visit: Payer: Self-pay | Admitting: *Deleted

## 2020-08-11 DIAGNOSIS — Z8632 Personal history of gestational diabetes: Secondary | ICD-10-CM

## 2020-08-16 ENCOUNTER — Other Ambulatory Visit: Payer: Self-pay

## 2020-08-16 ENCOUNTER — Ambulatory Visit (INDEPENDENT_AMBULATORY_CARE_PROVIDER_SITE_OTHER): Payer: Self-pay | Admitting: Nurse Practitioner

## 2020-08-16 ENCOUNTER — Encounter: Payer: Self-pay | Admitting: Nurse Practitioner

## 2020-08-16 DIAGNOSIS — Z8632 Personal history of gestational diabetes: Secondary | ICD-10-CM

## 2020-08-16 NOTE — Patient Instructions (Addendum)
Kegel Exercises  Kegel exercises can help strengthen your pelvic floor muscles. The pelvic floor is a group of muscles that support your rectum, small intestine, and bladder. In females, pelvic floor muscles also help support the womb (uterus). These muscles help you control the flow of urine and stool. Kegel exercises are painless and simple, and they do not require any equipment. Your provider may suggest Kegel exercises to:  Improve bladder and bowel control.  Improve sexual response.  Improve weak pelvic floor muscles after surgery to remove the uterus (hysterectomy) or pregnancy (females).  Improve weak pelvic floor muscles after prostate gland removal or surgery (males). Kegel exercises involve squeezing your pelvic floor muscles, which are the same muscles you squeeze when you try to stop the flow of urine or keep from passing gas. The exercises can be done while sitting, standing, or lying down, but it is best to vary your position. Exercises How to do Kegel exercises: 1. Squeeze your pelvic floor muscles tight. You should feel a tight lift in your rectal area. If you are a female, you should also feel a tightness in your vaginal area. Keep your stomach, buttocks, and legs relaxed. 2. Hold the muscles tight for up to 10 seconds. 3. Breathe normally. 4. Relax your muscles. 5. Repeat as told by your health care provider. Repeat this exercise daily as told by your health care provider. Continue to do this exercise for at least 4-6 weeks, or for as long as told by your health care provider. You may be referred to a physical therapist who can help you learn more about how to do Kegel exercises. Depending on your condition, your health care provider may recommend:  Varying how long you squeeze your muscles.  Doing several sets of exercises every day.  Doing exercises for several weeks.  Making Kegel exercises a part of your regular exercise routine. This information is not intended  to replace advice given to you by your health care provider. Make sure you discuss any questions you have with your health care provider. Document Revised: 10/31/2019 Document Reviewed: 02/13/2018 Elsevier Patient Education  2021 Elsevier Inc.    Check out Covid vaccine info here:   NoRevenue.com.cy  Find a vaccine:  Search [place] Covid Vaccine  7 Things You Should Know About the COVID-19 Vaccines  Here are seven facts you should know before taking your shot:  1.  No serious side effects were reported in clinical trials. Temporary reactions after receiving the vaccine may include a sore arm, headache, feeling tired and achy for a day or two or, in some cases, fever. In most cases, these reactions are good signs that your body is building protection.   2.  Scientists had a head start. They are built on decades of research on vaccines for similar viruses. A big investment of resources and focus made sure they were created without skipping any steps in development, testing, or clinical trials.  3. You cannot get COVID-19 from the vaccine. The vaccine gives your body instructions to make a protein that safely teaches you to make germ-fighting antibodies to fight the real COVID-19.  4.The vaccine protects against the Delta variant. The Delta variant, which is now predominant in [place], is much more contagious than the original virus. Vaccines continue to be remarkably effective in reducing risk of severe disease, hospitalization, and death, even against the Delta variant.  5. A hundred million people in the [place] have already received their COVID-19 vaccine.  6.  It works. And once you're fully vaccinated you're protected. The vaccines are proven to help prevent COVID-19 and are effective in preventing hospitalization and death.   7. The vaccine does not affect fertility. Vaccination for those who are pregnant or wanting to become pregnant is recommended by the  [employer] of Obstetricians and Gynecologists (ACOG), the Society for Maternal-Fetal Medicine (SMFM), the American Society for Reproductive Medicine (ASRM), and the Society for Female Reproduction and Urology.

## 2020-08-16 NOTE — Progress Notes (Signed)
Post Partum Visit Note  Kimberly Fox is a 25 y.o. [id number] female who presents for a postpartum visit. She is 6 weeks postpartum following a normal spontaneous vaginal delivery.  I have fully reviewed the prenatal and intrapartum course. The delivery was at 39.1 gestational weeks.  Anesthesia: none. Postpartum course has been good. Baby is doing well. Baby is feeding by both breast and bottle - Kimberly Fox. Bleeding no bleeding. Bowel function is normal. Bladder function is normal. Patient is not sexually active. Contraception method is none. Patient has appt at [facility] Department for IUD insertion. Postpartum depression screening: negative.   The pregnancy intention screening data noted above was reviewed. Potential methods of contraception were discussed. The patient elected to proceed with IUD or IUS.      The following portions of the patient's history were reviewed and updated as appropriate: allergies, current medications, past family history, past medical history, past social history, past surgical history and problem list.  Review of Systems Pertinent items noted in HPI and remainder of comprehensive ROS otherwise negative.    Objective:   Vitals:   08/16/20 0931  BP: 122/60  Pulse: 71    General:  alert, cooperative and no distress   Breasts:  deferred - breastfeeding  Lungs: clear to auscultation bilaterally  Heart:  regular rate and rhythm, S1, S2 normal, no murmur, click, rub or gallop  Abdomen: deferred   Vulva:  Pelvic deferred  Vagina:   Cervix:    Corpus:   Adnexa:    Rectal Exam:         Assessment:    normal postpartum exam. Pap smear not done at today's visit.   Plan:   Essential components of care per ACOG recommendations:  1.  Mood and well being: Patient with negative depression screening today. Reviewed local resources for support.  - Patient does not use tobacco. - hx of drug use? No    2. Infant care and feeding:   -Patient currently breastmilk feeding? Yes  -Social determinants of health (SDOH) reviewed in EPIC. No concerns  3. Sexuality, contraception and birth spacing - Patient does not want a pregnancy in the next year.   - Reviewed forms of contraception in tiered fashion. Patient desired IUD and [facility].   - Discussed birth spacing of 18 months  4. Sleep and fatigue -Encouraged family/partner/community support of 4 hrs of uninterrupted sleep to help with mood and fatigue  5. Physical Recovery  - Discussed patients delivery and complications - Patient had no lacerations, perineal healing reviewed. Patient expressed understanding - Patient has urinary incontinence? No - Patient is not safe to resume physical and sexual activity until IUD insertion  6.  Health Maintenance - Last pap smear done 07-22-18 and was normal with negative HPV.  7. Chronic Disease - has history of gestational diabetes.  Was taking metformin in pregnancy and told to continue it until her PP visit.  Has already had glucola started today.  Will continue glucola.  Advised patient to stop Metformin.  If returns this year, would likely do a HGB A1C for further evaluation since she is on medication at the time of the glucola. Reviewed good health to avoid diabetes in the future. Advised BMI less than 25, 150 minutes of brisk walking every week, 64 ounces of water daily. - PCP follow up  Marylynn Pearson, RN Center for [employer], [facility]  Nolene Bernheim, RN, MSN, NP-BC Nurse Practitioner, [facility] for [employer], [facility] 08/16/2020 9:57 AM

## 2020-08-17 LAB — GLUCOSE TOLERANCE, 2 HOURS
Glucose, 2 hour: 89 mg/dL (ref 65–139)
Glucose, GTT - Fasting: 103 mg/dL — ABNORMAL HIGH (ref 65–99)

## 2020-08-19 ENCOUNTER — Telehealth: Payer: Self-pay | Admitting: *Deleted

## 2020-08-19 ENCOUNTER — Ambulatory Visit: Payer: Self-pay

## 2020-08-19 NOTE — Telephone Encounter (Signed)
Pt left VM message stating that she had recent visit on 2/7 for postpartum care. Since yesterday, she has ben having trouble urinating and pain which is getting worse. I called pt back and offered appointment today @ 2:30 pm. Pt states that she will need to borrow a car but should be able to be here by 3:30 pm. I then offered appointment for tomorrow morning and pt stated she would rather come today. Pt was advised that if she cannot come to office today, she should go to an Urgent Care facility to be checked for possible bladder infection. She voiced understanding.

## 2020-08-20 ENCOUNTER — Ambulatory Visit: Payer: Self-pay

## 2020-08-25 IMAGING — CT CT ABD-PELV W/ CM
2 of 4 series · 16 of 46 positions shown, 18 images · IV contrast (omnipaque)
Comparison: Pelvic ultrasound earlier this day.

CLINICAL DATA: Right lower quadrant abdominal pain. Appendicitis
suspected. Pregnant patient at 17 weeks gestation.

EXAM:
CT ABDOMEN AND PELVIS WITH CONTRAST
TECHNIQUE: Multidetector CT imaging of the abdomen and pelvis was performed
using the standard protocol following bolus administration of
intravenous contrast.
CONTRAST:  100mL OMNIPAQUE IOHEXOL 300 MG/ML  SOLN

[Series 3: a/p w/ 5mm · axial · 0.89mm/px · z∈[-385,+35]mm · 13 of 92 slices shown, 15 images]
[im 4/92  soft-tissue]
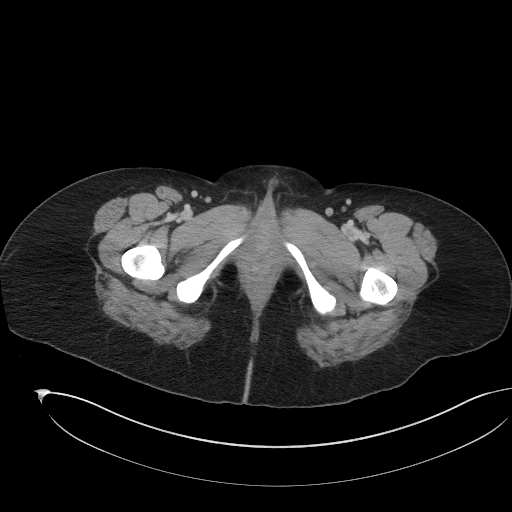
[im 4/92  bone]
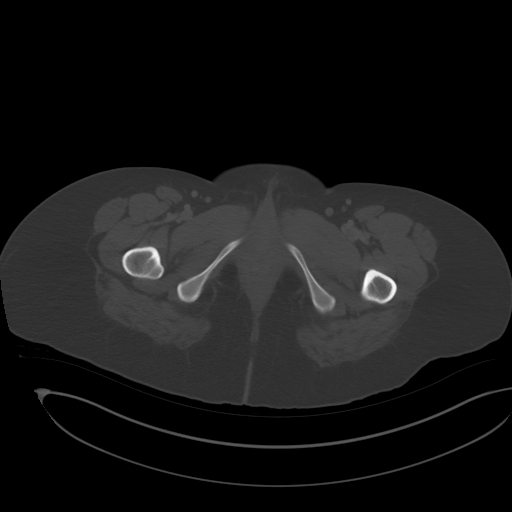
[im 11/92  soft-tissue]
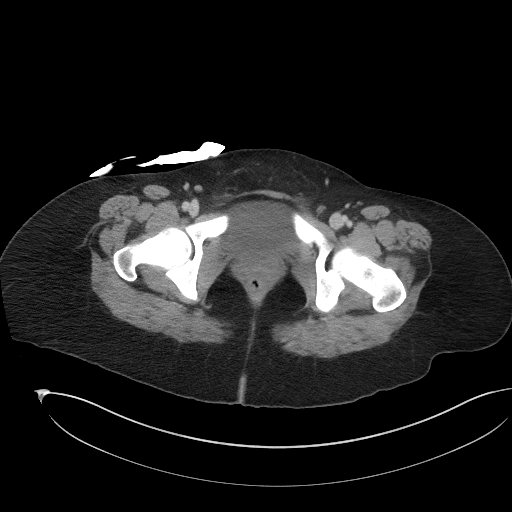
[im 19/92  soft-tissue]
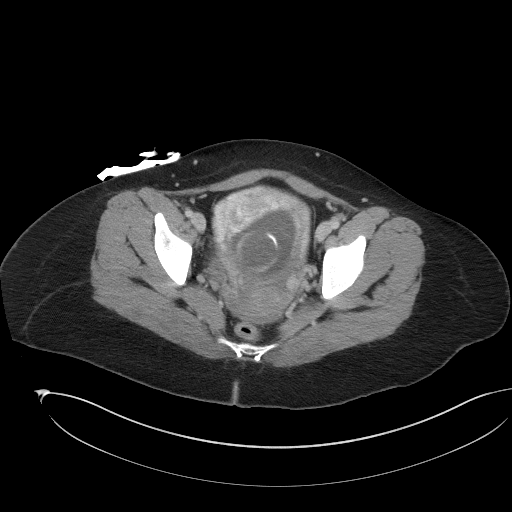
[im 26/92  soft-tissue]
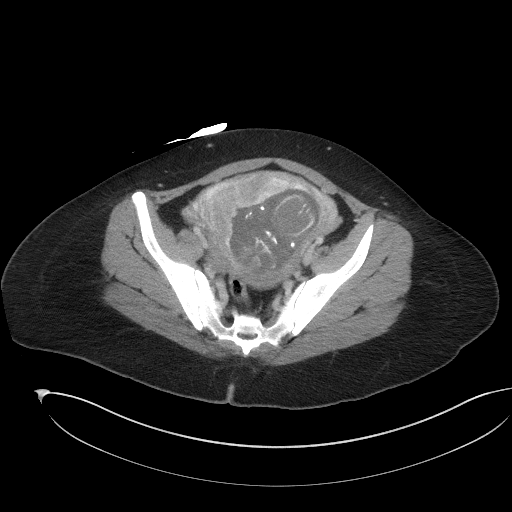
[im 33/92  soft-tissue]
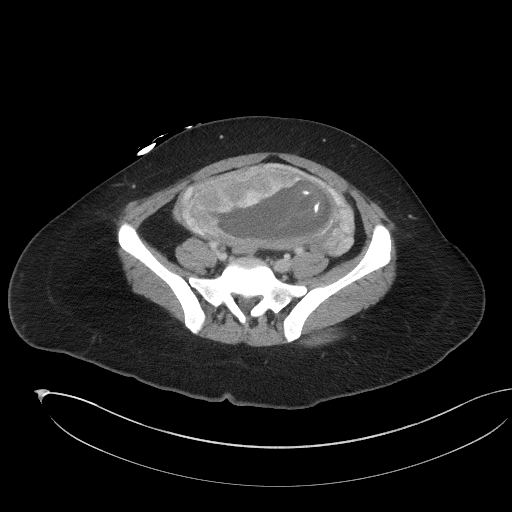
[im 41/92  soft-tissue]
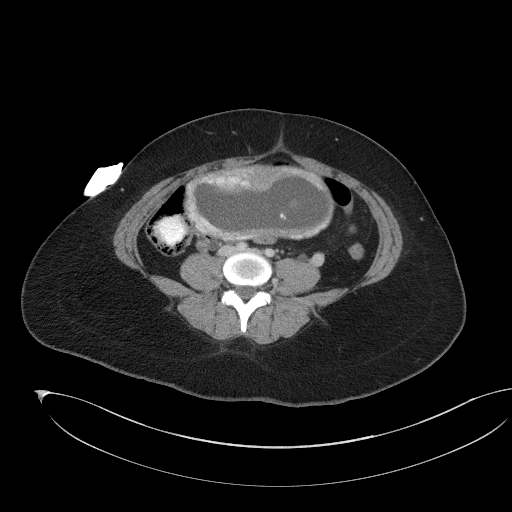
[im 48/92  soft-tissue]
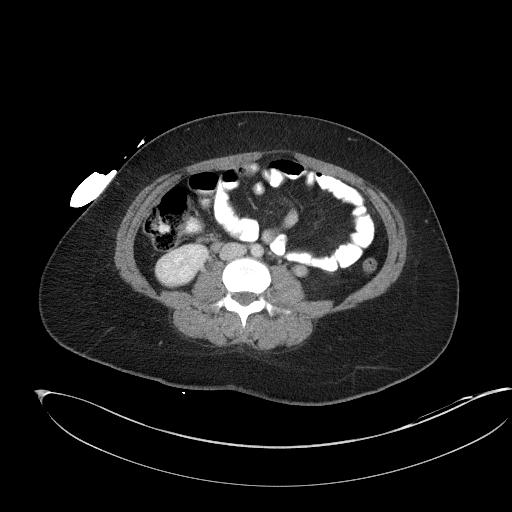
[im 51/92  soft-tissue]
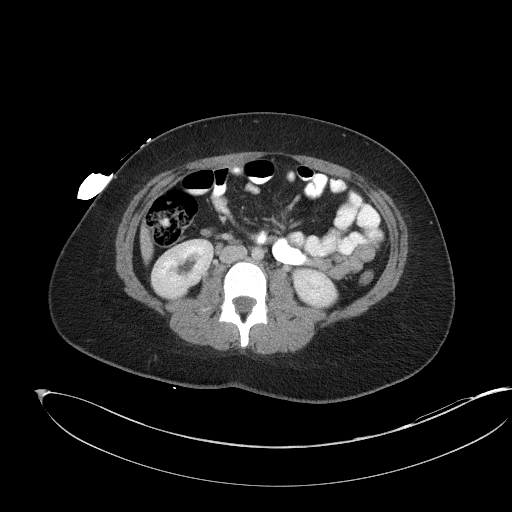
[im 59/92  soft-tissue]
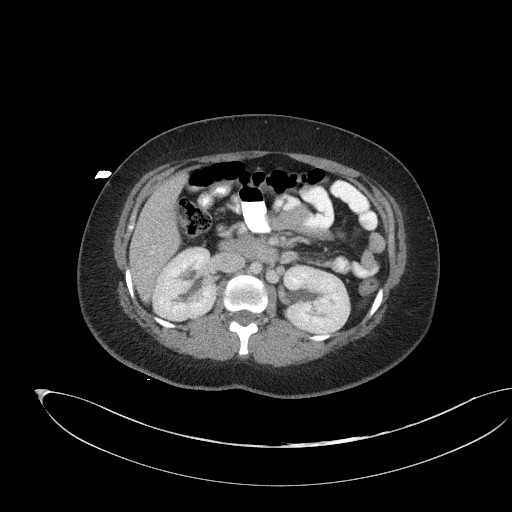
[im 59/92  bone]
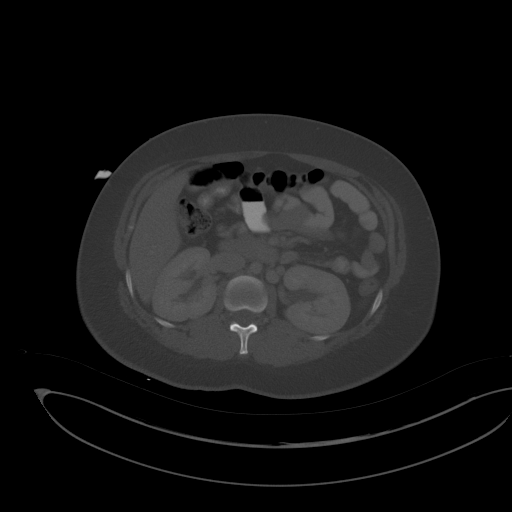
[im 66/92  soft-tissue]
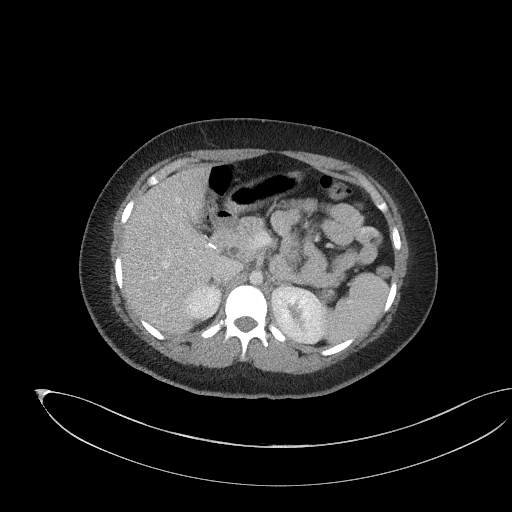
[im 73/92  soft-tissue]
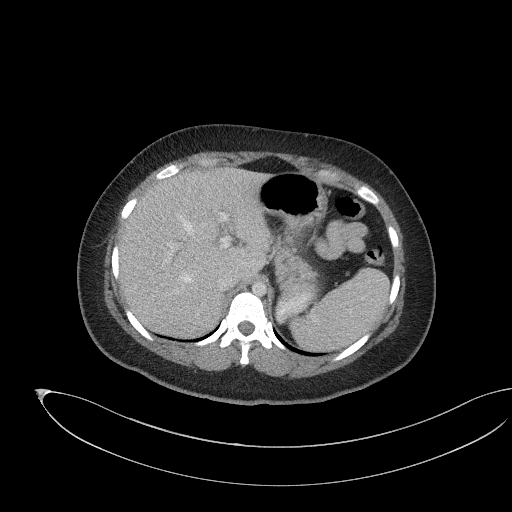
[im 81/92  soft-tissue]
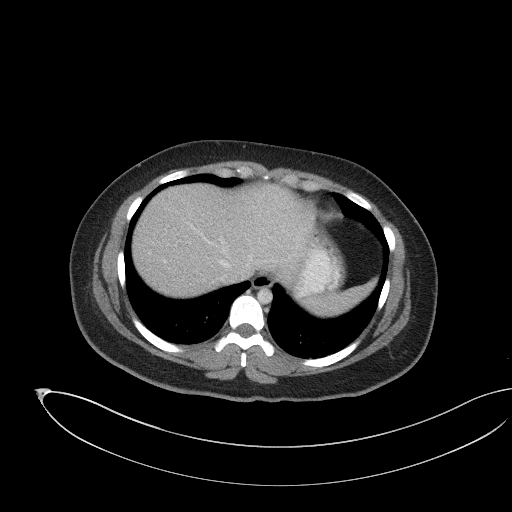
[im 88/92  soft-tissue]
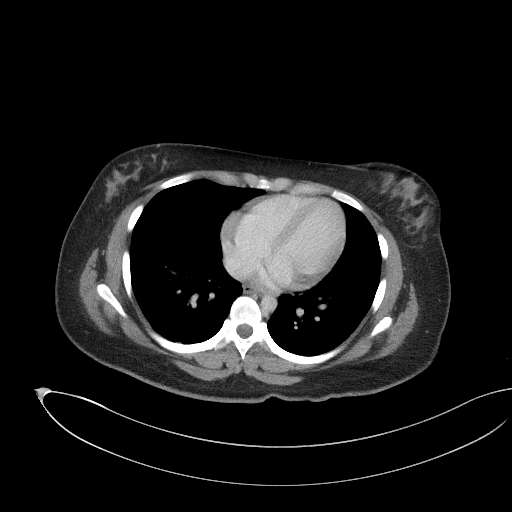

[Series 6: a/p w/ cor · coronal · 0.83mm/px · 3 of 149 slices shown]
[im 50/149  soft-tissue]
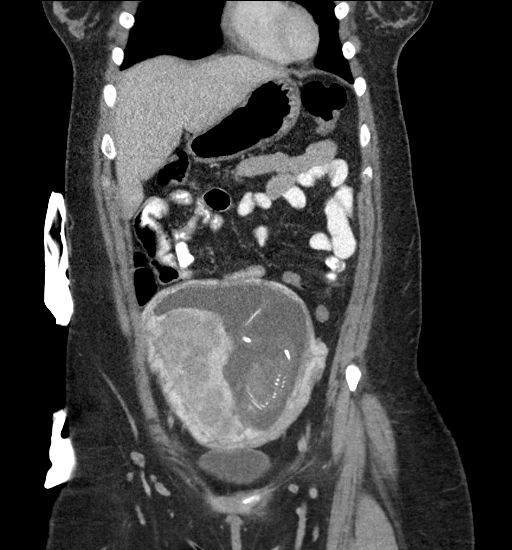
[im 66/149  soft-tissue]
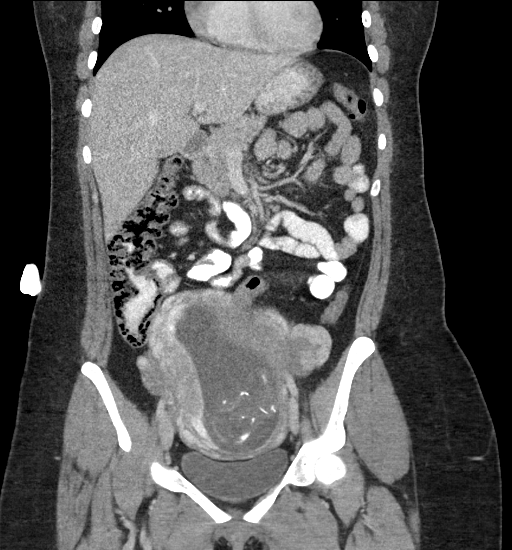
[im 83/149  soft-tissue]
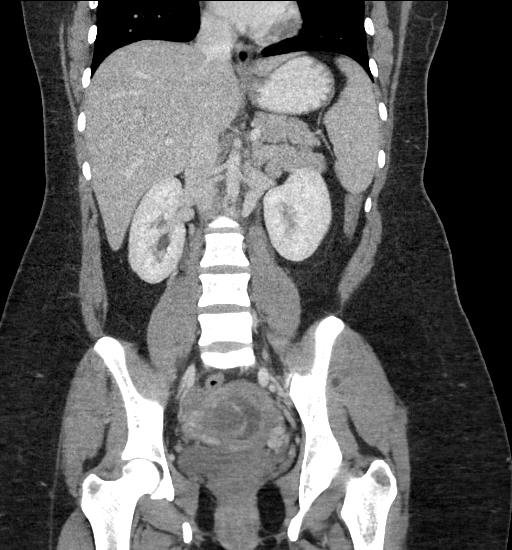

[16 of 46 positions shown; findings below may reference images not displayed]

FINDINGS: Lower chest: Lung bases are clear.

Hepatobiliary: No focal liver abnormality is seen. Status post
cholecystectomy. No biliary dilatation.

Pancreas: No ductal dilatation or inflammation.

Spleen: Normal in size without focal abnormality.

Adrenals/Urinary Tract: Normal adrenal glands. There is prominence
of bilateral renal collecting systems, right greater than left. No
renal or ureteral calculi. No perinephric edema. No focal renal
lesion. Urinary bladder is partially distended.

Stomach/Bowel: Normal air-filled appendix, series 6, image 72. No
appendicitis. Normal terminal ileum. No bowel obstruction,
administered enteric contrast reaches the colon. No bowel
inflammation or wall thickening.

Vascular/Lymphatic: No acute vascular findings. The abdominal aorta
is normal in caliber. Portal vein is patent. Prominent gonadal
vascularity, normal for pregnancy.

Reproductive: Gravid uterus. Ovaries were assessed on ultrasound
earlier today.

Other: No free air, free fluid, or intra-abdominal fluid collection.
Tiny fat containing umbilical hernia.

Musculoskeletal: There are no acute or suspicious osseous
abnormalities. No musculoskeletal findings to explain right-sided
pain.
IMPRESSION: 1. Normal appendix.
2. Prominence of bilateral renal collecting systems, right greater
than left, likely pregnancy related. No stone or perinephric edema.
3. Gravid uterus.

## 2022-08-26 ENCOUNTER — Other Ambulatory Visit: Payer: Self-pay

## 2022-08-26 ENCOUNTER — Emergency Department (HOSPITAL_BASED_OUTPATIENT_CLINIC_OR_DEPARTMENT_OTHER)
Admission: EM | Admit: 2022-08-26 | Discharge: 2022-08-26 | Disposition: A | Discharge status: Home / Self Care | Payer: Self-pay | Attending: Emergency Medicine | Admitting: Emergency Medicine

## 2022-08-26 ENCOUNTER — Encounter (HOSPITAL_BASED_OUTPATIENT_CLINIC_OR_DEPARTMENT_OTHER): Payer: Self-pay

## 2022-08-26 DIAGNOSIS — R197 Diarrhea, unspecified: Secondary | ICD-10-CM

## 2022-08-26 DIAGNOSIS — R1084 Generalized abdominal pain: Secondary | ICD-10-CM | POA: Insufficient documentation

## 2022-08-26 LAB — CBC
HCT: 41.2 % (ref 36.0–46.0)
Hemoglobin: 13.7 g/dL (ref 12.0–15.0)
MCH: 29 pg (ref 26.0–34.0)
MCHC: 33.3 g/dL (ref 30.0–36.0)
MCV: 87.3 fL (ref 80.0–100.0)
Platelets: 254 10*3/uL (ref 150–400)
RBC: 4.72 MIL/uL (ref 3.87–5.11)
RDW: 12.9 % (ref 11.5–15.5)
WBC: 8.6 10*3/uL (ref 4.0–10.5)
nRBC: 0 % (ref 0.0–0.2)

## 2022-08-26 LAB — COMPREHENSIVE METABOLIC PANEL
ALT: 12 U/L (ref 0–44)
AST: 16 U/L (ref 15–41)
Albumin: 4.2 g/dL (ref 3.5–5.0)
Alkaline Phosphatase: 78 U/L (ref 38–126)
Anion gap: 6 (ref 5–15)
BUN: 13 mg/dL (ref 6–20)
CO2: 23 mmol/L (ref 22–32)
Calcium: 8.6 mg/dL — ABNORMAL LOW (ref 8.9–10.3)
Chloride: 106 mmol/L (ref 98–111)
Creatinine, Ser: 0.66 mg/dL (ref 0.44–1.00)
GFR, Estimated: >60 mL/min (ref >60)
Glucose, Bld: 98 mg/dL (ref 70–99)
Potassium: 3.7 mmol/L (ref 3.5–5.1)
Sodium: 135 mmol/L (ref 135–145)
Total Bilirubin: 0.6 mg/dL (ref 0.3–1.2)
Total Protein: 7.6 g/dL (ref 6.5–8.1)

## 2022-08-26 LAB — URINALYSIS, MICROSCOPIC (REFLEX)

## 2022-08-26 LAB — URINALYSIS, ROUTINE W REFLEX MICROSCOPIC
Bilirubin Urine: NEGATIVE
Glucose, UA: NEGATIVE mg/dL
Ketones, ur: 15 mg/dL — AB
Leukocytes,Ua: NEGATIVE
Nitrite: NEGATIVE
Protein, ur: NEGATIVE mg/dL
Specific Gravity, Urine: 1.02 (ref 1.005–1.030)
pH: 5.5 (ref 5.0–8.0)

## 2022-08-26 LAB — PREGNANCY, URINE: Preg Test, Ur: NEGATIVE

## 2022-08-26 LAB — LIPASE, BLOOD: Lipase: 41 U/L (ref 11–51)

## 2022-08-26 MED ORDER — CHOLESTYRAMINE 4 G PO PACK: 4.0000 g | PACK | Freq: Three times a day (TID) | ORAL | 0 refills | Status: AC

## 2022-08-26 MED ORDER — ONDANSETRON 4 MG PO TBDP: ORAL_TABLET | ORAL | 0 refills | Status: AC

## 2022-08-26 NOTE — Discharge Instructions (Addendum)
Call your GI doctor and let them know that your symptoms are worsened and you came to the emergency department for evaluation.  They may be able to move your appointment up.  I have given you information for our gastroenterologist office on call if you so choose. Try pepcid or tagamet up to twice a day.  Try to avoid things that may make this worse, most commonly these are spicy foods tomato based products fatty foods chocolate and peppermint.  Alcohol and tobacco can also make this worse.  Return to the emergency department for sudden worsening pain fever or inability to eat or drink.

## 2022-08-26 NOTE — ED Provider Notes (Signed)
[place] [facility] [place] Provider Note   CSN: [id number] Arrival date & time: 08/26/22  1652     History  Chief Complaint  Patient presents with   Abdominal Pain   Diarrhea         Kimberly Fox is a 27 y.o. female.  27 yo F with a chief complaints of diarrhea and abdominal pain.  This has been ongoing since she had her gallbladder out this December.  She tells me that every time she eats she developed some epigastric pain and she feels like it goes down throughout her abdomen.  Seems to be worse in the upper part of the abdomen.  Has had diarrhea with it.  Seems to be worse with eating and drinking.  No fevers no vomiting.  She went to an urgent care center and was referred to a gastroenterologist.  Her appointment is in about 11 days.  Failure symptoms are worse today and so came here for evaluation.   Abdominal Pain Associated symptoms: diarrhea   Diarrhea Associated symptoms: abdominal pain        Home Medications Prior to Admission medications   Medication Sig Start Date End Date Taking? Authorizing Provider  cholestyramine (QUESTRAN) 4 g packet Take 1 packet (4 g total) by mouth 3 (three) times daily with meals. 08/26/22  Yes Deno Etienne, DO  ondansetron (ZOFRAN-ODT) 4 MG disintegrating tablet 62m ODT q4 hours prn nausea/vomit 08/26/22  Yes FDeno Etienne DO  prenatal vitamin w/FE, FA (PRENATAL 1 + 1) 27-1 MG TABS tablet Take 1 tablet by mouth daily at 12 noon.    [provider]      Allergies    Patient has no known allergies.    Review of Systems   Review of Systems  Gastrointestinal:  Positive for abdominal pain and diarrhea.    Physical Exam Updated Vital Signs BP 128/73 (BP Location: Left Arm)   Pulse 79   Temp 98.3 F (36.8 C) (Oral)   Resp 18   SpO2 99%  Physical Exam Vitals and nursing note reviewed.  Constitutional:      General: She is not in acute distress.    Appearance: She is well-developed.  She is not diaphoretic.  HENT:     Head: Normocephalic and atraumatic.  Eyes:     Pupils: Pupils are equal, round, and reactive to light.  Cardiovascular:     Rate and Rhythm: Normal rate and regular rhythm.     Heart sounds: No murmur heard.    No friction rub. No gallop.  Pulmonary:     Effort: Pulmonary effort is normal.     Breath sounds: No wheezing or rales.  Abdominal:     General: There is no distension.     Palpations: Abdomen is soft.     Tenderness: There is no abdominal tenderness.     Comments: Mild diffuse abdominal discomfort without obvious focality.  Musculoskeletal:        General: No tenderness.     Cervical back: Normal range of motion and neck supple.  Skin:    General: Skin is warm and dry.  Neurological:     Mental Status: She is alert and oriented to person, place, and time.  Psychiatric:        Behavior: Behavior normal.     ED Results / Procedures / Treatments   Labs (all labs ordered are listed, but only abnormal results are displayed) Labs Reviewed  COMPREHENSIVE METABOLIC PANEL -  Abnormal; Notable for the following components:      Result Value   Calcium 8.6 (*)    All other components within normal limits  URINALYSIS, ROUTINE W REFLEX MICROSCOPIC - Abnormal; Notable for the following components:   Hgb urine dipstick SMALL (*)    Ketones, ur 15 (*)    All other components within normal limits  URINALYSIS, MICROSCOPIC (REFLEX) - Abnormal; Notable for the following components:   Bacteria, UA FEW (*)    All other components within normal limits  LIPASE, BLOOD  CBC  PREGNANCY, URINE    EKG None  Radiology No results found.  Procedures Procedures    Medications Ordered in ED Medications - No data to display  ED Course/ Medical Decision Making/ A&P                             Medical Decision Making Amount and/or Complexity of Data Reviewed Labs: ordered.  Risk Prescription drug management.   27 yo F with a chief  complaints of abdominal pain.  This been going on for a little bit over 2 months.  Seems to be worse with eating and drinking.  Occurred after having her gallbladder removed.  Diarrhea is a big component of this.  I encouraged her to follow-up with her general surgeon.  I encouraged her to follow-up with her gastroenterologist.  Will do a trial of bile sequestrant's.  UA negative for infection, no leukocytosis, LFTs and lipase are unremarkable.  With a benign abdominal exam I discussed with her about how I felt imaging would be unhelpful.  8:06 PM:  I have discussed the diagnosis/risks/treatment options with the patient and family.  Evaluation and diagnostic testing in the emergency department does not suggest an emergent condition requiring admission or immediate intervention beyond what has been performed at this time.  They will follow up with PCP, GI, Gen surgery. We also discussed returning to the ED immediately if new or worsening sx occur. We discussed the sx which are most concerning (e.g., sudden worsening pain, fever, inability to tolerate by mouth) that necessitate immediate return. Medications administered to the patient during their visit and any new prescriptions provided to the patient are listed below.  Medications given during this visit Medications - No data to display   The patient appears reasonably screen and/or stabilized for discharge and I doubt any other medical condition or other [facility] requiring further screening, evaluation, or treatment in the ED at this time prior to discharge.          Final Clinical Impression(s) / ED Diagnoses Final diagnoses:  Diarrhea, unspecified type  Generalized abdominal pain    Rx / DC Orders ED Discharge Orders          Ordered    cholestyramine (QUESTRAN) 4 g packet  3 times daily with meals        08/26/22 2002    ondansetron (ZOFRAN-ODT) 4 MG disintegrating tablet        08/26/22 2002              Deno Etienne,  DO 08/26/22 2006

## 2022-08-26 NOTE — ED Triage Notes (Signed)
Pt c/o intermittent episodes of epigastric to lower abd pain since December, associated diarrhea. Pt states pain is worse after eating. Irregular menses. Seen at [facility] for same, reports GI follow up 2/28

## 2023-07-31 ENCOUNTER — Emergency Department (HOSPITAL_COMMUNITY): Payer: BLUE CROSS/BLUE SHIELD

## 2023-07-31 ENCOUNTER — Emergency Department (HOSPITAL_COMMUNITY)
Admission: EM | Admit: 2023-07-31 | Discharge: 2023-07-31 | Disposition: A | Discharge status: Home / Self Care | Payer: BLUE CROSS/BLUE SHIELD

## 2023-07-31 ENCOUNTER — Encounter (HOSPITAL_COMMUNITY): Payer: Self-pay

## 2023-07-31 ENCOUNTER — Other Ambulatory Visit: Payer: Self-pay

## 2023-07-31 DIAGNOSIS — R0602 Shortness of breath: Secondary | ICD-10-CM | POA: Insufficient documentation

## 2023-07-31 DIAGNOSIS — R079 Chest pain, unspecified: Secondary | ICD-10-CM | POA: Insufficient documentation

## 2023-07-31 DIAGNOSIS — R1013 Epigastric pain: Secondary | ICD-10-CM | POA: Insufficient documentation

## 2023-07-31 DIAGNOSIS — R319 Hematuria, unspecified: Secondary | ICD-10-CM | POA: Insufficient documentation

## 2023-07-31 DIAGNOSIS — R55 Syncope and collapse: Secondary | ICD-10-CM | POA: Insufficient documentation

## 2023-07-31 LAB — CBC
HCT: 40.2 % (ref 36.0–46.0)
Hemoglobin: 13.6 g/dL (ref 12.0–15.0)
MCH: 29 pg (ref 26.0–34.0)
MCHC: 33.8 g/dL (ref 30.0–36.0)
MCV: 85.7 fL (ref 80.0–100.0)
Platelets: 208 10*3/uL (ref 150–400)
RBC: 4.69 MIL/uL (ref 3.87–5.11)
RDW: 12.8 % (ref 11.5–15.5)
WBC: 8.3 10*3/uL (ref 4.0–10.5)
nRBC: 0 % (ref 0.0–0.2)

## 2023-07-31 LAB — URINALYSIS, ROUTINE W REFLEX MICROSCOPIC
Bilirubin Urine: NEGATIVE
Glucose, UA: NEGATIVE mg/dL
Ketones, ur: NEGATIVE mg/dL
Nitrite: NEGATIVE
Protein, ur: NEGATIVE mg/dL
Specific Gravity, Urine: 1.005 (ref 1.005–1.030)
pH: 6 (ref 5.0–8.0)

## 2023-07-31 LAB — BASIC METABOLIC PANEL
Anion gap: 11 (ref 5–15)
BUN: 10 mg/dL (ref 6–20)
CO2: 23 mmol/L (ref 22–32)
Calcium: 9.1 mg/dL (ref 8.9–10.3)
Chloride: 103 mmol/L (ref 98–111)
Creatinine, Ser: 0.69 mg/dL (ref 0.44–1.00)
GFR, Estimated: >60 mL/min (ref >60)
Glucose, Bld: 100 mg/dL — ABNORMAL HIGH (ref 70–99)
Potassium: 3.1 mmol/L — ABNORMAL LOW (ref 3.5–5.1)
Sodium: 137 mmol/L (ref 135–145)

## 2023-07-31 LAB — HCG, SERUM, QUALITATIVE: Preg, Serum: NEGATIVE

## 2023-07-31 MED ORDER — POTASSIUM CHLORIDE CRYS ER 20 MEQ PO TBCR
40.0000 meq | EXTENDED_RELEASE_TABLET | Freq: Once | ORAL | Status: AC
  Administered 2023-07-31: 40 meq via ORAL
  Filled 2023-07-31: qty 2

## 2023-07-31 MED ORDER — CEPHALEXIN 500 MG PO CAPS: 500.0000 mg | ORAL_CAPSULE | Freq: Two times a day (BID) | ORAL | 0 refills | Status: AC

## 2023-07-31 NOTE — ED Triage Notes (Signed)
BIBA from urgent care for UTI and hypokalemia. When nurse went to start IV at [facility] patient had syncopal episode.  Pt reports diarrhea and abd pain since December when she had gallbladder removed. Pt reports epigastric pain with eating.

## 2023-07-31 NOTE — ED Provider Notes (Signed)
[place] EMERGENCY DEPARTMENT AT [facility] Provider Note   CSN: [id number] Arrival date & time: 07/31/23  1842     History  Chief Complaint  Patient presents with   Loss of Consciousness    Kimberly Fox is a 28 y.o. female.  28 year old female presenting emergency department for syncope.  Was at urgent care and had syncopal event after they went to insert IV.  Patient went to urgent care as she was having some chest pain shortness of breath and right-sided flank pain.  Chest pain improved.  Low risk for PE based on Wells criteria.    Loss of Consciousness      Home Medications Prior to Admission medications   Medication Sig Start Date End Date Taking? Authorizing Provider  cholestyramine (QUESTRAN) 4 g packet Take 1 packet (4 g total) by mouth 3 (three) times daily with meals. 08/26/22   Melene Plan, DO  ondansetron (ZOFRAN-ODT) 4 MG disintegrating tablet 4mg  ODT q4 hours prn nausea/vomit 08/26/22   Melene Plan, DO  prenatal vitamin w/FE, FA (PRENATAL 1 + 1) 27-1 MG TABS tablet Take 1 tablet by mouth daily at 12 noon.    [provider]      Allergies    Patient has no known allergies.    Review of Systems   Review of Systems  Cardiovascular:  Positive for syncope.    Physical Exam Updated Vital Signs BP (!) 115/58   Pulse (!) 101   Temp 98.2 F (36.8 C) (Oral)   Resp 16   Ht 5' (1.524 m)   Wt 67 kg   SpO2 100%   BMI 28.85 kg/m  Physical Exam Vitals and nursing note reviewed.  HENT:     Head: Normocephalic.     Nose: Nose normal.     Mouth/Throat:     Mouth: Mucous membranes are moist.  Eyes:     Conjunctiva/sclera: Conjunctivae normal.  Cardiovascular:     Rate and Rhythm: Normal rate and regular rhythm.  Pulmonary:     Effort: Pulmonary effort is normal.  Abdominal:     General: Abdomen is flat. There is no distension.     Palpations: Abdomen is soft.     Tenderness: There is no abdominal tenderness. There is no  guarding or rebound.  Musculoskeletal:        General: Normal range of motion.  Skin:    General: Skin is warm and dry.     Capillary Refill: Capillary refill takes less than 2 seconds.  Neurological:     Mental Status: She is alert and oriented to person, place, and time.  Psychiatric:        Behavior: Behavior normal.     ED Results / Procedures / Treatments   Labs (all labs ordered are listed, but only abnormal results are displayed) Labs Reviewed  BASIC METABOLIC PANEL - Abnormal; Notable for the following components:      Result Value   Potassium 3.1 (*)    Glucose, Bld 100 (*)    All other components within normal limits  URINALYSIS, ROUTINE W REFLEX MICROSCOPIC - Abnormal; Notable for the following components:   Color, Urine STRAW (*)    Hgb urine dipstick MODERATE (*)    Leukocytes,Ua SMALL (*)    Bacteria, UA RARE (*)    All other components within normal limits  CBC  HCG, SERUM, QUALITATIVE    EKG EKG Interpretation Date/Time:  Tuesday July 31 2023 18:53:27 EST Ventricular Rate:  104 PR Interval:  155 QRS Duration:  92 QT Interval:  313 QTC Calculation: 412 R Axis:   73  Text Interpretation: Sinus tachycardia Consider right atrial enlargement RSR' in V1 or V2, probably normal variant Nonspecific T abnormalities, anterior leads Baseline wander in lead(s) I III aVL Confirmed by Estanislado Pandy [phone]) on 07/31/2023 9:21:37 PM  Radiology CT Renal Stone Study Result Date: 07/31/2023 CLINICAL DATA:  Abdominal pain, diarrhea, postprandial pain, urinary tract infection EXAM: CT ABDOMEN AND PELVIS WITHOUT CONTRAST TECHNIQUE: Multidetector CT imaging of the abdomen and pelvis was performed following the standard protocol without IV contrast. Unenhanced CT was performed per clinician order. Lack of IV contrast limits sensitivity and specificity, especially for evaluation of abdominal/pelvic solid viscera. RADIATION DOSE REDUCTION: This exam was performed according to the  departmental dose-optimization program which includes automated exposure control, adjustment of the mA and/or kV according to patient size and/or use of iterative reconstruction technique. COMPARISON:  02/04/2020 FINDINGS: Lower chest: No acute pleural or parenchymal lung disease. Hepatobiliary: Cholecystectomy. Unremarkable unenhanced appearance of the liver. Pancreas: Unremarkable unenhanced appearance. Spleen: Unremarkable unenhanced appearance. Adrenals/Urinary Tract: No urinary tract calculi or obstructive uropathy within either kidney. The adrenals and bladder are unremarkable. Stomach/Bowel: No bowel obstruction or ileus. Normal appendix right lower quadrant. No bowel wall thickening or inflammatory change. Vascular/Lymphatic: No significant vascular findings are present. No enlarged abdominal or pelvic lymph nodes. Reproductive: Uterus and bilateral adnexa are unremarkable. Other: No free fluid or free intraperitoneal gas. No abdominal wall hernia. Musculoskeletal: No acute or destructive bony abnormalities. Reconstructed images demonstrate no additional findings. IMPRESSION: 1. Unremarkable unenhanced CT of the abdomen and pelvis. Electronically Signed   By: Sharlet Salina M.D.   On: 07/31/2023 22:32   DG Chest Portable 1 View Result Date: 07/31/2023 CLINICAL DATA:  Chest pain, epigastric pain, syncope, UTI EXAM: PORTABLE CHEST 1 VIEW COMPARISON:  None Available. FINDINGS: The heart size and mediastinal contours are within normal limits. Both lungs are clear. The visualized skeletal structures are unremarkable. IMPRESSION: No active disease. Electronically Signed   By: Minerva Fester M.D.   On: 07/31/2023 21:50    Procedures Procedures    Medications Ordered in ED Medications  potassium chloride SA (KLOR-CON M) CR tablet 40 mEq (has no administration in time range)    ED Course/ Medical Decision Making/ A&P Clinical Course as of 07/31/23 2314  Tue Jul 31, 2023  2243 CT Renal Stone  Study IMPRESSION: 1. Unremarkable unenhanced CT of the abdomen and pelvis.   [TY]    Clinical Course User Index [TY] Coral Spikes, DO                                 Medical Decision Making This is a well-appearing 28 year old female presenting emergency department after a syncopal episode at urgent care.  Suspect vasovagal given there was with IV placement.  She is afebrile nontachycardic hemodynamically stable here.  EKG without arrhythmia or ST segment changes to indicate ischemia.  She has no significant metabolic derangements other than mild low potassium.  Repleted.  CBC with no leukocytosis to suggest systemic infection.  Pregnancy test negative.  Ectopic pregnancy unlikely.  Did have some hematuria on UA.  Concern for possible stone given her right-sided pain.  However CT renal without stone.  Possible UTI.  Will treat with antibiotics.  Stable for discharge.  Amount and/or Complexity of Data Reviewed Labs: ordered. Radiology: ordered. Decision-making  details documented in ED Course.  Risk Prescription drug management.          Final Clinical Impression(s) / ED Diagnoses Final diagnoses:  None    Rx / DC Orders ED Discharge Orders     None         Coral Spikes, DO 07/31/23 2314
